# Patient Record
Sex: Female | Born: 1980 | Race: White | Hispanic: No | Marital: Single | Smoking: Former smoker
Health system: Southern US, Community
[De-identification: ages and names within clinical notes are randomized; demographics above are authoritative.]

## PROBLEM LIST (undated history)

## (undated) DIAGNOSIS — E282 Polycystic ovarian syndrome: Secondary | ICD-10-CM

## (undated) DIAGNOSIS — F4310 Post-traumatic stress disorder, unspecified: Secondary | ICD-10-CM

## (undated) HISTORY — DX: Post-traumatic stress disorder, unspecified: F43.10

## (undated) HISTORY — DX: Polycystic ovarian syndrome: E28.2

---

## 2008-12-05 HISTORY — PX: KNEE ARTHROSCOPY: SUR90

## 2016-02-05 ENCOUNTER — Other Ambulatory Visit: Payer: Self-pay | Admitting: Anesthesiology

## 2016-02-16 ENCOUNTER — Encounter (HOSPITAL_COMMUNITY): Payer: Self-pay

## 2016-02-16 ENCOUNTER — Encounter (HOSPITAL_COMMUNITY)
Admission: RE | Admit: 2016-02-16 | Discharge: 2016-02-16 | Disposition: A | Discharge status: Home / Self Care | Payer: Self-pay | Source: Ambulatory Visit | Attending: Anesthesiology | Admitting: Anesthesiology

## 2016-02-16 DIAGNOSIS — Z01812 Encounter for preprocedural laboratory examination: Secondary | ICD-10-CM | POA: Insufficient documentation

## 2016-02-16 DIAGNOSIS — G90512 Complex regional pain syndrome I of left upper limb: Secondary | ICD-10-CM | POA: Insufficient documentation

## 2016-02-16 LAB — BASIC METABOLIC PANEL
ANION GAP: 9 (ref 5–15)
BUN: 9 mg/dL (ref 6–20)
CHLORIDE: 110 mmol/L (ref 101–111)
CO2: 22 mmol/L (ref 22–32)
Calcium: 9.5 mg/dL (ref 8.9–10.3)
Creatinine, Ser: 0.75 mg/dL (ref 0.44–1.00)
GFR calc non Af Amer: >60 mL/min (ref >60)
Glucose, Bld: 93 mg/dL (ref 65–99)
POTASSIUM: 4.1 mmol/L (ref 3.5–5.1)
SODIUM: 141 mmol/L (ref 135–145)

## 2016-02-16 LAB — CBC
HEMATOCRIT: 45.9 % (ref 36.0–46.0)
HEMOGLOBIN: 14.7 g/dL (ref 12.0–15.0)
MCH: 30.1 pg (ref 26.0–34.0)
MCHC: 32 g/dL (ref 30.0–36.0)
MCV: 94.1 fL (ref 78.0–100.0)
Platelets: 324 10*3/uL (ref 150–400)
RBC: 4.88 MIL/uL (ref 3.87–5.11)
RDW: 12.6 % (ref 11.5–15.5)
WBC: 6.5 10*3/uL (ref 4.0–10.5)

## 2016-02-16 LAB — SURGICAL PCR SCREEN
MRSA, PCR: NEGATIVE
STAPHYLOCOCCUS AUREUS: NEGATIVE

## 2016-02-16 LAB — PROTIME-INR
INR: 1.03 (ref 0.00–1.49)
Prothrombin Time: 13.7 seconds (ref 11.6–15.2)

## 2016-02-16 LAB — HCG, SERUM, QUALITATIVE: PREG SERUM: NEGATIVE

## 2016-02-16 LAB — APTT: aPTT: 35 seconds (ref 24–37)

## 2016-02-16 NOTE — Pre-Procedure Instructions (Addendum)
Christine Johnston  02/16/2016      [facility] [id number] - [place], [place] - [address] AT [facility] [address] [place] [id number] Phone: [phone] Fax: [phone]    Your procedure is scheduled on 02/19/16  [facility] at 745 A.M.  Call this number if you have problems the morning of surgery:  [phone]   Remember:  Do not eat food or drink liquids after midnight.  Take these medicines the morning of surgery with A SIP OF WATER none   STOP all herbel meds, nsaids (aleve,naproxen,advil,ibuprofen)Today including vitamins ,aspirin   Do not wear jewelry, make-up or nail polish.  Do not wear lotions, powders, or perfumes.  You may wear deodorant.  Do not shave 48 hours prior to surgery.  Men may shave face and neck.  Do not bring valuables to the hospital.  [facility] is not responsible for any belongings or valuables.  Contacts, dentures or bridgework may not be worn into surgery.  Leave your suitcase in the car.  After surgery it may be brought to your room.  For patients admitted to the hospital, discharge time will be determined by your treatment team.  Patients discharged the day of surgery will not be allowed to drive home.   Name and phone number of your driver:    Special instructions:   Special Instructions: [place] - Preparing for Surgery  Before surgery, you can play an important role.  Because skin is not sterile, your skin needs to be as free of germs as possible.  You can reduce the number of germs on you skin by washing with CHG (chlorahexidine gluconate) soap before surgery.  CHG is an antiseptic cleaner which kills germs and bonds with the skin to continue killing germs even after washing.  Please DO NOT use if you have an allergy to CHG or antibacterial soaps.  If your skin becomes reddened/irritated stop using the CHG and inform your nurse when you arrive at Short Stay.  Do not  shave (including legs and underarms) for at least 48 hours prior to the first CHG shower.  You may shave your face.  Please follow these instructions carefully:   1.  Shower with CHG Soap the night before surgery and the morning of Surgery.  2.  If you choose to wash your hair, wash your hair first as usual with your normal shampoo.  3.  After you shampoo, rinse your hair and body thoroughly to remove the Shampoo.  4.  Use CHG as you would any other liquid soap.  You can apply chg directly  to the skin and wash gently with scrungie or a clean washcloth.  5.  Apply the CHG Soap to your body ONLY FROM THE NECK DOWN.  Do not use on open wounds or open sores.  Avoid contact with your eyes ears, mouth and genitals (private parts).  Wash genitals (private parts)       with your normal soap.  6.  Wash thoroughly, paying special attention to the area where your surgery will be performed.  7.  Thoroughly rinse your body with warm water from the neck down.  8.  DO NOT shower/wash with your normal soap after using and rinsing off the CHG Soap.  9.  Pat yourself dry with a clean towel.            10.  Wear clean pajamas.  11.  Place clean sheets on your bed the night of your first shower and do not sleep with pets.  Day of Surgery  Do not apply any lotions/deodorants the morning of surgery.  Please wear clean clothes to the hospital/surgery center.  Please read over the following fact sheets that you were given. Pain Booklet, Coughing and Deep Breathing, MRSA Information and Surgical Site Infection Prevention

## 2016-02-18 MED ORDER — CEFAZOLIN SODIUM-DEXTROSE 2-3 GM-% IV SOLR
2.0000 g | INTRAVENOUS | Status: AC
  Administered 2016-02-19: 2 g via INTRAVENOUS
  Filled 2016-02-18: qty 50

## 2016-02-18 NOTE — H&P (Signed)
Christine Johnston is an 35 y.o. female.   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  She was initially seen at [facility] and x-rays were performed that were normal.  She then underwent a series of conservative treatments.  Symptoms seemed to worsen, and Dr. Fredna Johnston, who she was seeing at the time, was concerned for CRPS.  She was referred to our practice  in December 2015 for further evaluation.  She indeed made [place] criteria for diagnosis of CRPS. She underwent a series of 6 stellate ganglion blocks with no sustainable relief.  She has trialed multiple medications to include amitriptyline, nortriptyline, Cymbalta, Topamax, clonidine, as well as compound creams with no benefit.  She continues to utilize Nucynta and Lyrica, with nominal to modest improvement in her pain symptoms.   She has also been through extensive physical therapy for desensitization.  An H wave unit was trialed while she was in physical therapy, and found to be at least modestly beneficial while it was on.  It does seem to serve to reduce some of her swelling/edema symptoms.  Given the limited improvement in her symptoms and signs with all of these various approaches, in May a trial of SCS was proposed.   There are substantive data for the utility of spinal cord stimulation in the treatment of this disease.  Christine Johnston has trialed intervention, and most of the drug classes for which there are data in the treatment of CRPS. Nucynta and Lyrica have provided modest benefit, but she only takes the Nucynta once a day because it makes her too drowsy, and cannont increase the Lyrica dose for the same reason.   The patient is a full-time student--clearly making an effort to retrain and be able to engage in the work force --and cannot take the medication as  prescribed due to making her too drowsy.   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   She noticed a 50% improvement in her pain symptoms with her SCS trial.  She also states that the swelling in her hand has improved significantly.  She is now able to see the knuckles on her left hand.  She also states that she has been utilizing less medication while utilizing the stimulator.    No past medical history on file.  Past Surgical History  Procedure Laterality Date  . Knee arthroscopy Left 2010    No family history on file. Social History:  reports that she has been smoking Cigarettes.  She has a 5 pack-year smoking history. She does not have any smokeless tobacco history on file. She reports that she does not drink alcohol or use illicit drugs.  Allergies:  Allergies  Allergen Reactions  . Tramadol Swelling    Facial swelling    Medications Prior to Admission  Medication Sig Dispense Refill  . pregabalin (LYRICA) 50 MG capsule Take 50 mg by mouth at bedtime.    . tapentadol (NUCYNTA) 50 MG TABS tablet Take 50 mg by mouth at bedtime.      Results for orders placed or performed during the hospital encounter of 02/16/16 (from the past 48 hour(s))  APTT  Status: None   Collection Time: 02/16/16  9:53 AM  Result Value Ref Range   aPTT 35 24 - 37 seconds  Protime-INR     Status: None   Collection Time: 02/16/16  9:53 AM  Result Value Ref Range   Prothrombin Time 13.7 11.6 - 15.2 seconds   INR 1.03 0.00 - 1.49  CBC     Status: None   Collection Time: 02/16/16  9:53 AM  Result Value Ref Range   WBC 6.5 4.0 - 10.5 K/uL   RBC 4.88 3.87 - 5.11 MIL/uL   Hemoglobin 14.7 12.0 - 15.0 g/dL   HCT 45.9 36.0 - 46.0 %   MCV 94.1 78.0 - 100.0 fL   MCH 30.1 26.0 - 34.0 pg   MCHC 32.0  30.0 - 36.0 g/dL   RDW 12.6 11.5 - 15.5 %   Platelets 324 150 - 400 K/uL  Basic metabolic panel     Status: None   Collection Time: 02/16/16  9:53 AM  Result Value Ref Range   Sodium 141 135 - 145 mmol/L   Potassium 4.1 3.5 - 5.1 mmol/L   Chloride 110 101 - 111 mmol/L   CO2 22 22 - 32 mmol/L   Glucose, Bld 93 65 - 99 mg/dL   BUN 9 6 - 20 mg/dL   Creatinine, Ser 0.75 0.44 - 1.00 mg/dL   Calcium 9.5 8.9 - 10.3 mg/dL   GFR calc non Af Amer >60 >60 mL/min   GFR calc Af Amer >60 >60 mL/min    Comment: (NOTE) The eGFR has been calculated using the CKD EPI equation. This calculation has not been validated in all clinical situations. eGFR's persistently <60 mL/min signify possible Chronic Kidney Disease.    Anion gap 9 5 - 15  Surgical pcr screen     Status: None   Collection Time: 02/16/16  9:53 AM  Result Value Ref Range   MRSA, PCR NEGATIVE NEGATIVE   Staphylococcus aureus NEGATIVE NEGATIVE    Comment:        The Xpert SA Assay (FDA approved for NASAL specimens in patients over 67 years of age), is one component of a comprehensive surveillance program.  Test performance has been validated by [facility] for patients greater than or equal to 65 year old. It is not intended to diagnose infection nor to guide or monitor treatment.   hCG, serum, qualitative     Status: None   Collection Time: 02/16/16  9:53 AM  Result Value Ref Range   Preg, Serum NEGATIVE NEGATIVE    Comment:        THE SENSITIVITY OF THIS METHODOLOGY IS >10 mIU/mL.    No results found.  Review of Systems  Constitutional: Negative.   HENT: Negative.   Eyes: Negative.   Respiratory: Negative.   Cardiovascular: Negative.   Gastrointestinal: Negative.   Genitourinary: Negative.   Musculoskeletal: Negative.   Skin: Negative.   Neurological: Negative.   Endo/Heme/Allergies: Negative.   Psychiatric/Behavioral: Negative.     Blood pressure 115/68, pulse 66, temperature 98.1 F (36.7 C),  temperature source Oral, resp. rate 18, weight 106.601 kg (235 lb 0.2 oz), last menstrual period 12/22/2015, SpO2 97 %. Physical Exam  Constitutional: She is oriented to person, place, and time. She appears well-developed and well-nourished.  HENT:  Head: Normocephalic and atraumatic.  Eyes: EOM are normal. Pupils are equal, round, and reactive to light.  Neck: Normal range of motion.  Cardiovascular: Normal rate and regular rhythm.  Musculoskeletal: Normal range of motion.  Neurological: She is alert and oriented to person, place, and time.  Skin: Skin is warm and dry.  Psychiatric: She has a normal mood and affect. Her behavior is normal. Judgment and thought content normal.     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

## 2016-02-19 ENCOUNTER — Encounter (HOSPITAL_COMMUNITY): Payer: Self-pay | Admitting: Anesthesiology

## 2016-02-19 ENCOUNTER — Ambulatory Visit (HOSPITAL_COMMUNITY): Payer: Worker's Compensation | Admitting: Anesthesiology

## 2016-02-19 ENCOUNTER — Ambulatory Visit (HOSPITAL_COMMUNITY): Payer: Worker's Compensation

## 2016-02-19 ENCOUNTER — Encounter (HOSPITAL_COMMUNITY)
Admission: RE | Disposition: A | Discharge status: Home / Self Care | Payer: Self-pay | Source: Ambulatory Visit | Attending: Anesthesiology

## 2016-02-19 ENCOUNTER — Ambulatory Visit (HOSPITAL_COMMUNITY)
Admission: RE | Admit: 2016-02-19 | Discharge: 2016-02-19 | Disposition: A | Discharge status: Home / Self Care | Payer: Worker's Compensation | Source: Ambulatory Visit | Attending: Anesthesiology | Admitting: Anesthesiology

## 2016-02-19 DIAGNOSIS — G90512 Complex regional pain syndrome I of left upper limb: Secondary | ICD-10-CM | POA: Diagnosis present

## 2016-02-19 DIAGNOSIS — F17210 Nicotine dependence, cigarettes, uncomplicated: Secondary | ICD-10-CM | POA: Insufficient documentation

## 2016-02-19 DIAGNOSIS — Z419 Encounter for procedure for purposes other than remedying health state, unspecified: Secondary | ICD-10-CM

## 2016-02-19 HISTORY — PX: SPINAL CORD STIMULATOR INSERTION: SHX5378

## 2016-02-19 SURGERY — CERVICAL SPINAL CORD STIMULATOR INSERTION
Anesthesia: Monitor Anesthesia Care | Site: Spine Cervical

## 2016-02-19 MED ORDER — MIDAZOLAM HCL 2 MG/2ML IJ SOLN
INTRAMUSCULAR | Status: AC
  Filled 2016-02-19: qty 2

## 2016-02-19 MED ORDER — STERILE WATER FOR INJECTION IJ SOLN
INTRAMUSCULAR | Status: AC
  Filled 2016-02-19: qty 10

## 2016-02-19 MED ORDER — 0.9 % SODIUM CHLORIDE (POUR BTL) OPTIME
TOPICAL | Status: DC | PRN
  Administered 2016-02-19: 1000 mL

## 2016-02-19 MED ORDER — FENTANYL CITRATE (PF) 250 MCG/5ML IJ SOLN
INTRAMUSCULAR | Status: AC
  Filled 2016-02-19: qty 5

## 2016-02-19 MED ORDER — ONDANSETRON HCL 4 MG/2ML IJ SOLN
INTRAMUSCULAR | Status: AC
  Filled 2016-02-19: qty 2

## 2016-02-19 MED ORDER — SODIUM CHLORIDE 0.9 % IJ SOLN
INTRAMUSCULAR | Status: AC
  Filled 2016-02-19: qty 10

## 2016-02-19 MED ORDER — PROMETHAZINE HCL 25 MG/ML IJ SOLN
6.2500 mg | INTRAMUSCULAR | Status: DC | PRN
  Administered 2016-02-19: 6.25 mg via INTRAVENOUS

## 2016-02-19 MED ORDER — LACTATED RINGERS IV SOLN
INTRAVENOUS | Status: DC
  Administered 2016-02-19: 09:00:00 via INTRAVENOUS

## 2016-02-19 MED ORDER — MIDAZOLAM HCL 5 MG/5ML IJ SOLN
INTRAMUSCULAR | Status: DC | PRN
  Administered 2016-02-19 (×2): 1 mg via INTRAVENOUS

## 2016-02-19 MED ORDER — DEXMEDETOMIDINE HCL IN NACL 200 MCG/50ML IV SOLN
INTRAVENOUS | Status: AC
  Filled 2016-02-19: qty 100

## 2016-02-19 MED ORDER — PROMETHAZINE HCL 25 MG/ML IJ SOLN
INTRAMUSCULAR | Status: AC
  Filled 2016-02-19: qty 1

## 2016-02-19 MED ORDER — BUPIVACAINE-EPINEPHRINE (PF) 0.5% -1:200000 IJ SOLN
INTRAMUSCULAR | Status: DC | PRN
  Administered 2016-02-19: 54 mL via PERINEURAL

## 2016-02-19 MED ORDER — HYDROMORPHONE HCL 1 MG/ML IJ SOLN: 0.2500 mg | INTRAMUSCULAR | Status: DC | PRN

## 2016-02-19 MED ORDER — LACTATED RINGERS IV SOLN
INTRAVENOUS | Status: DC | PRN
  Administered 2016-02-19: 09:00:00 via INTRAVENOUS

## 2016-02-19 MED ORDER — SUCCINYLCHOLINE CHLORIDE 20 MG/ML IJ SOLN
INTRAMUSCULAR | Status: AC
  Filled 2016-02-19: qty 1

## 2016-02-19 MED ORDER — HYDROMORPHONE HCL 4 MG PO TABS: 4.0000 mg | ORAL_TABLET | ORAL | Status: DC | PRN

## 2016-02-19 MED ORDER — PROPOFOL 10 MG/ML IV BOLUS
INTRAVENOUS | Status: AC
  Filled 2016-02-19: qty 20

## 2016-02-19 MED ORDER — PHENYLEPHRINE 40 MCG/ML (10ML) SYRINGE FOR IV PUSH (FOR BLOOD PRESSURE SUPPORT)
PREFILLED_SYRINGE | INTRAVENOUS | Status: AC
  Filled 2016-02-19: qty 10

## 2016-02-19 MED ORDER — CEPHALEXIN 500 MG PO CAPS: 500.0000 mg | ORAL_CAPSULE | Freq: Three times a day (TID) | ORAL | Status: DC

## 2016-02-19 MED ORDER — ARTIFICIAL TEARS OP OINT
TOPICAL_OINTMENT | OPHTHALMIC | Status: AC
  Filled 2016-02-19: qty 3.5

## 2016-02-19 MED ORDER — EPHEDRINE SULFATE 50 MG/ML IJ SOLN
INTRAMUSCULAR | Status: AC
  Filled 2016-02-19: qty 1

## 2016-02-19 MED ORDER — SODIUM CHLORIDE 0.9 % IR SOLN
Status: DC | PRN
  Administered 2016-02-19: 11:00:00

## 2016-02-19 MED ORDER — LIDOCAINE HCL (CARDIAC) 20 MG/ML IV SOLN
INTRAVENOUS | Status: AC
  Filled 2016-02-19: qty 10

## 2016-02-19 MED ORDER — FENTANYL CITRATE (PF) 250 MCG/5ML IJ SOLN
INTRAMUSCULAR | Status: DC | PRN
  Administered 2016-02-19 (×10): 50 ug via INTRAVENOUS

## 2016-02-19 MED ORDER — MEPERIDINE HCL 25 MG/ML IJ SOLN: 6.2500 mg | INTRAMUSCULAR | Status: DC | PRN

## 2016-02-19 MED ORDER — MIDAZOLAM HCL 2 MG/2ML IJ SOLN: 0.5000 mg | Freq: Once | INTRAMUSCULAR | Status: DC | PRN

## 2016-02-19 MED ORDER — DEXMEDETOMIDINE HCL IN NACL 200 MCG/50ML IV SOLN
INTRAVENOUS | Status: DC | PRN
  Administered 2016-02-19: 0.7 ug/kg/h via INTRAVENOUS

## 2016-02-19 MED ORDER — BACITRACIN ZINC 500 UNIT/GM EX OINT
TOPICAL_OINTMENT | CUTANEOUS | Status: DC | PRN
  Administered 2016-02-19: 1 via TOPICAL

## 2016-02-19 MED ORDER — PROPOFOL 10 MG/ML IV BOLUS
INTRAVENOUS | Status: DC | PRN
  Administered 2016-02-19 (×2): 10 mg via INTRAVENOUS
  Administered 2016-02-19 (×2): 20 mg via INTRAVENOUS
  Administered 2016-02-19 (×2): 10 mg via INTRAVENOUS

## 2016-02-19 SURGICAL SUPPLY — 58 items
ANCHOR CLIK X NEURO (Stimulator) ×2 IMPLANT
BAG DECANTER FOR FLEXI CONT (MISCELLANEOUS) ×2 IMPLANT
BINDER ABDOMINAL 12 ML 46-62 (SOFTGOODS) ×2 IMPLANT
CABLE OR STIMULATOR 2X8 61 (WIRE) ×2 IMPLANT
CABLE/EXTENSION OR 1X16 61 (CABLE) ×2 IMPLANT
CHLORAPREP W/TINT 26ML (MISCELLANEOUS) ×2 IMPLANT
DRAPE C-ARM 42X72 X-RAY (DRAPES) ×2 IMPLANT
DRAPE C-ARMOR (DRAPES) ×2 IMPLANT
DRAPE LAPAROTOMY 100X72X124 (DRAPES) ×2 IMPLANT
DRAPE POUCH INSTRU U-SHP 10X18 (DRAPES) ×2 IMPLANT
DRAPE SURG 17X23 STRL (DRAPES) ×2 IMPLANT
DRSG OPSITE POSTOP 3X4 (GAUZE/BANDAGES/DRESSINGS) ×4 IMPLANT
DRSG OPSITE POSTOP 4X6 (GAUZE/BANDAGES/DRESSINGS) IMPLANT
ELECT REM PT RETURN 9FT ADLT (ELECTROSURGICAL) ×2
ELECTRODE REM PT RTRN 9FT ADLT (ELECTROSURGICAL) ×1 IMPLANT
GAUZE SPONGE 4X4 16PLY XRAY LF (GAUZE/BANDAGES/DRESSINGS) ×2 IMPLANT
GLOVE BIOGEL PI IND STRL 7.5 (GLOVE) ×1 IMPLANT
GLOVE BIOGEL PI INDICATOR 7.5 (GLOVE) ×1
GLOVE ECLIPSE 7.5 STRL STRAW (GLOVE) ×2 IMPLANT
GLOVE EXAM NITRILE LRG STRL (GLOVE) IMPLANT
GLOVE EXAM NITRILE MD LF STRL (GLOVE) IMPLANT
GLOVE EXAM NITRILE XL STR (GLOVE) IMPLANT
GLOVE EXAM NITRILE XS STR PU (GLOVE) IMPLANT
GOWN STRL REUS W/ TWL LRG LVL3 (GOWN DISPOSABLE) IMPLANT
GOWN STRL REUS W/ TWL XL LVL3 (GOWN DISPOSABLE) IMPLANT
GOWN STRL REUS W/TWL 2XL LVL3 (GOWN DISPOSABLE) IMPLANT
GOWN STRL REUS W/TWL LRG LVL3 (GOWN DISPOSABLE)
GOWN STRL REUS W/TWL XL LVL3 (GOWN DISPOSABLE)
IPG PRECISION SPECTRA (Stimulator) ×2 IMPLANT
KIT BASIN OR (CUSTOM PROCEDURE TRAY) ×2 IMPLANT
KIT CHARGING (KITS) ×1
KIT CHARGING PRECISION NEURO (KITS) ×1 IMPLANT
KIT LEAD INFINION (Lead) ×2 IMPLANT
KIT PAT PROGRAM FREELINK (KITS) ×1 IMPLANT
KIT ROOM TURNOVER OR (KITS) ×2 IMPLANT
KIT SPLITTER 30CM 2X8 (Stimulator) ×4 IMPLANT
LEAD PERCUTANEOUS (Neuro Prosthesis/Implant) ×2 IMPLANT
LIQUID BAND (GAUZE/BANDAGES/DRESSINGS) IMPLANT
NEEDLE 18GX1X1/2 (RX/OR ONLY) (NEEDLE) IMPLANT
NEEDLE ENTRADA 5IN (NEEDLE) ×2 IMPLANT
NEEDLE HYPO 25X1 1.5 SAFETY (NEEDLE) ×2 IMPLANT
NS IRRIG 1000ML POUR BTL (IV SOLUTION) ×2 IMPLANT
PACK LAMINECTOMY NEURO (CUSTOM PROCEDURE TRAY) ×2 IMPLANT
PAD ARMBOARD 7.5X6 YLW CONV (MISCELLANEOUS) ×2 IMPLANT
REMOTE CONTROL KIT (KITS) ×2
SPONGE LAP 4X18 X RAY DECT (DISPOSABLE) IMPLANT
STAPLER SKIN PROX WIDE 3.9 (STAPLE) ×2 IMPLANT
SUT MNCRL AB 3-0 PS2 18 (SUTURE) IMPLANT
SUT MNCRL AB 4-0 PS2 18 (SUTURE) IMPLANT
SUT SILK 0 (SUTURE) ×1
SUT SILK 0 MO-6 18XCR BRD 8 (SUTURE) ×1 IMPLANT
SUT SILK 2 0 TIES 10X30 (SUTURE) IMPLANT
SUT VIC AB 2-0 CP2 18 (SUTURE) ×8 IMPLANT
SYRINGE 10CC LL (SYRINGE) IMPLANT
TOWEL OR 17X24 6PK STRL BLUE (TOWEL DISPOSABLE) IMPLANT
TOWEL OR 17X26 10 PK STRL BLUE (TOWEL DISPOSABLE) ×2 IMPLANT
WATER STERILE IRR 1000ML POUR (IV SOLUTION) ×2 IMPLANT
YANKAUER SUCT BULB TIP NO VENT (SUCTIONS) ×2 IMPLANT

## 2016-02-19 NOTE — Anesthesia Preprocedure Evaluation (Addendum)
Anesthesia Evaluation  Patient identified by MRN, date of birth, ID band Patient awake    Reviewed: Allergy & Precautions, NPO status , Patient's Chart, lab work & pertinent test results  History of Anesthesia Complications Negative for: history of anesthetic complications  Airway Mallampati: II  TM Distance: >3 FB Neck ROM: Full    Dental  (+) Poor Dentition, Dental Advisory Given   Pulmonary neg pulmonary ROS, Current Smoker,    breath sounds clear to auscultation       Cardiovascular negative cardio ROS   Rhythm:Regular Rate:Normal     Neuro/Psych Chronic pain: narcotics    GI/Hepatic negative GI ROS, Neg liver ROS,   Endo/Other  Morbid obesity  Renal/GU negative Renal ROS     Musculoskeletal   Abdominal (+) + obese,   Peds  Hematology negative hematology ROS (+)   Anesthesia Other Findings   Reproductive/Obstetrics 02/16/16 preg test NEG                           Anesthesia Physical Anesthesia Plan  ASA: II  Anesthesia Plan: MAC   Post-op Pain Management:    Induction:   Airway Management Planned: Nasal Cannula  Additional Equipment:   Intra-op Plan:   Post-operative Plan:   Informed Consent: I have reviewed the patients History and Physical, chart, labs and discussed the procedure including the risks, benefits and alternatives for the proposed anesthesia with the patient or authorized representative who has indicated his/her understanding and acceptance.   Dental advisory given  Plan Discussed with: Anesthesiologist, Surgeon and CRNA  Anesthesia Plan Comments: (Plan routine monitors, MAC)       Anesthesia Quick Evaluation

## 2016-02-19 NOTE — Discharge Instructions (Signed)
Dr. Ollen BowlHarkins Post-Op Orders   Ice Pack - 20 minutes on (in a pillow case), and 20 minutes off. Wear the ice pack UNDER the binder.  Follow up in office, they will call you for an appointment in 10 days to 2 weeks.  Increase activity gradually.    No lifting anything heavier than a gallon of milk (10 pounds) until seen in the office.  Advance diet slowly as tolerated.  Dressing care:  Keep dressing dry for 3 days, and on Post-op day 4, may shower.  Call for fever, drainage, and redness.  No swimming or bathing in a bathtub (do not get into standing water).  Lie flat as much as possible for the next 2 days. Drink plenty of fluids, including caffeinated.

## 2016-02-19 NOTE — Op Note (Signed)
PREOP DX: 1) CRPS left upper extremity  POSTOP DX: same as preop  PROCEDURES PERFORMED: 1) implantation of 1 70 cm 16 contact [employer] SCS lead, 1 70 cm 8 contact [employer] SCS lead 2) implantation of [employer] Spectra IPG 3) I/O fluoro with interpretation 4) postoperative complex SCS programming  SURGEON: Rilla Buckman  ASSISTANT: none  ANESTHESIA: MAC/TIVA  EBL:<45mL  DESCRIPTION OF PROCEDURE: After a discussion of risks, benefits and alternatives, written informed consent was obtained. The patient was taken to the operative suite where she was placed on the table in the prone position with chest rolls and a head/face cushion. A timeout was taken, identifying the patient, procedure, personnel, equipment, antibiotic administration and staff concerns (of which there were none).   The cervical, thoracic and lumbar spine were widely prepped with duraprep, and draped into a sterile field. An adequate plane of anesthesia was established. Fluoroscopy was utilized to plan a left paramedian placement of leads into the T11-12 space and then after anesthetizing the skin and subcutaneous tissue with 0.25% bupivicaine 1:200K epinephrine, an incision was made and carried down to the dorsal lumbar  fascia. 14g Touhy needles were used to access the intended space using biplanar fluoroscopy and loss-of-resistance technique, and two 70 cm leads were manipulated into the cervical epidural space under live fluoroscopy so that the distal tip of the 16 contact lead  Was at the superior aspect of the C5 vertebral body shadow, and the octrode positioned across C6;  the 16 contact lead was positioned immediately left paramedian, and the octrode immediately left of the other lead. The patient was aroused, and the leads tested to verify good coverage. The patient reported good coverage into the left upper extremities and particularly the hand.   With good coverage established, the needles and stylets  were backed off the leads, with fluoro verifying no migration of the leads from their tested positions. 2-0 silk sutures were placed into the fascia, tied, and then used to fix the leads to the fascia using [employer] anchors.   Attention was turned to creation of a subcutaneous pocket in the patients right flank. An incision was planned, the skin and subcutaneous tissues anesthetized with 0.25% bupivicaine 1:200K epinephrine, and then an incision made and a pocket developed using blunt dissection and the Bovie electrocautery. Hemostasis was assured in the pocket, and then the leads passed into the pocket using a reverse Seldinger technique. The leads were meticulously cleaned, and inserted into the generator. Impedances were checked and found to be good in each contact; the leads were then fixed into the generator using a self torquing wrench. Impedances were rechecked and found to be good in each contact.   Each incision was copiously irrigated with over 800 mL of bacitracin-containing irrigant. The thoracolumbar incision was closed with 2 layers of interrupted 2-0 vicryl sutures and the skin closed with a staples. The pocket incision was closed with a deep layer of 2-0 interrupted vicryl sutures, and the skin closed with staples. Bacitracin ointment and sterile dressings were applied and the patient taken to the PACU. Needle, instrument and sponge counts were correct x2 at the end of the case.  COMPLICATIONS: lumbar puncture with attempts to place the 2nd lead into the T12-L1 interspace. 2nd lead was placed in the T11-12 interspace and no other CSF was observed throughout the rest of the case.   CONDITION: stable throughout the course of the procedure and immediately afterward to PACU  DISPOSITION: Discharge home. Pain  medicine and prophylactic antibiotics prescribed.  May remove dressings and shower on post-op day #4. No submerging. Case and care discussed with patient and fiance.

## 2016-02-19 NOTE — Anesthesia Postprocedure Evaluation (Signed)
Anesthesia Post Note  Patient: Christine Johnston  Procedure(s) Performed: Procedure(s) (LRB): CERVICAL SPINAL CORD STIMULATOR INSERTION (N/A)  Patient location during evaluation: PACU Anesthesia Type: General Level of consciousness: awake and alert, oriented and patient cooperative Pain management: pain level controlled Vital Signs Assessment: post-procedure vital signs reviewed and stable Respiratory status: spontaneous breathing, nonlabored ventilation and respiratory function stable Cardiovascular status: blood pressure returned to baseline and stable Postop Assessment: no signs of nausea or vomiting Anesthetic complications: no    Last Vitals:  Filed Vitals:   02/19/16 1337 02/19/16 1400  BP: 101/60   Pulse: 49 44  Temp:  36.1 C  Resp: 23 21    Last Pain:  Filed Vitals:   02/19/16 1442  PainSc: 6                  Jakyrie Totherow,E. Sharmeka Palmisano

## 2016-02-19 NOTE — Transfer of Care (Signed)
Immediate Anesthesia Transfer of Care Note  Patient: Christine Johnston  Procedure(s) Performed: Procedure(s): CERVICAL SPINAL CORD STIMULATOR INSERTION (N/A)  Patient Location: PACU  Anesthesia Type:MAC  Level of Consciousness: sedated  Airway & Oxygen Therapy: Patient Spontanous Breathing and Patient connected to nasal cannula oxygen  Post-op Assessment: Report given to RN and Post -op Vital signs reviewed and stable  Post vital signs: Reviewed and stable  Last Vitals:  Filed Vitals:   02/19/16 0814  BP: 115/68  Pulse: 66  Temp: 36.7 C  Resp: 18    Complications: No apparent anesthesia complications

## 2016-02-22 ENCOUNTER — Encounter (HOSPITAL_COMMUNITY): Payer: Self-pay | Admitting: Anesthesiology

## 2017-04-10 IMAGING — RF DG C-ARM GT 120 MIN
1 series · 1 of 1 positions shown · non-contrast
Comparison: None.

CLINICAL DATA: Cervical spinal stimulator

EXAM:
DG C-ARM GT 120 MIN; DG CERVICAL SPINE - 1 VIEW

[Series 1: run · 1 of 1 slices shown]
[im 1/1]
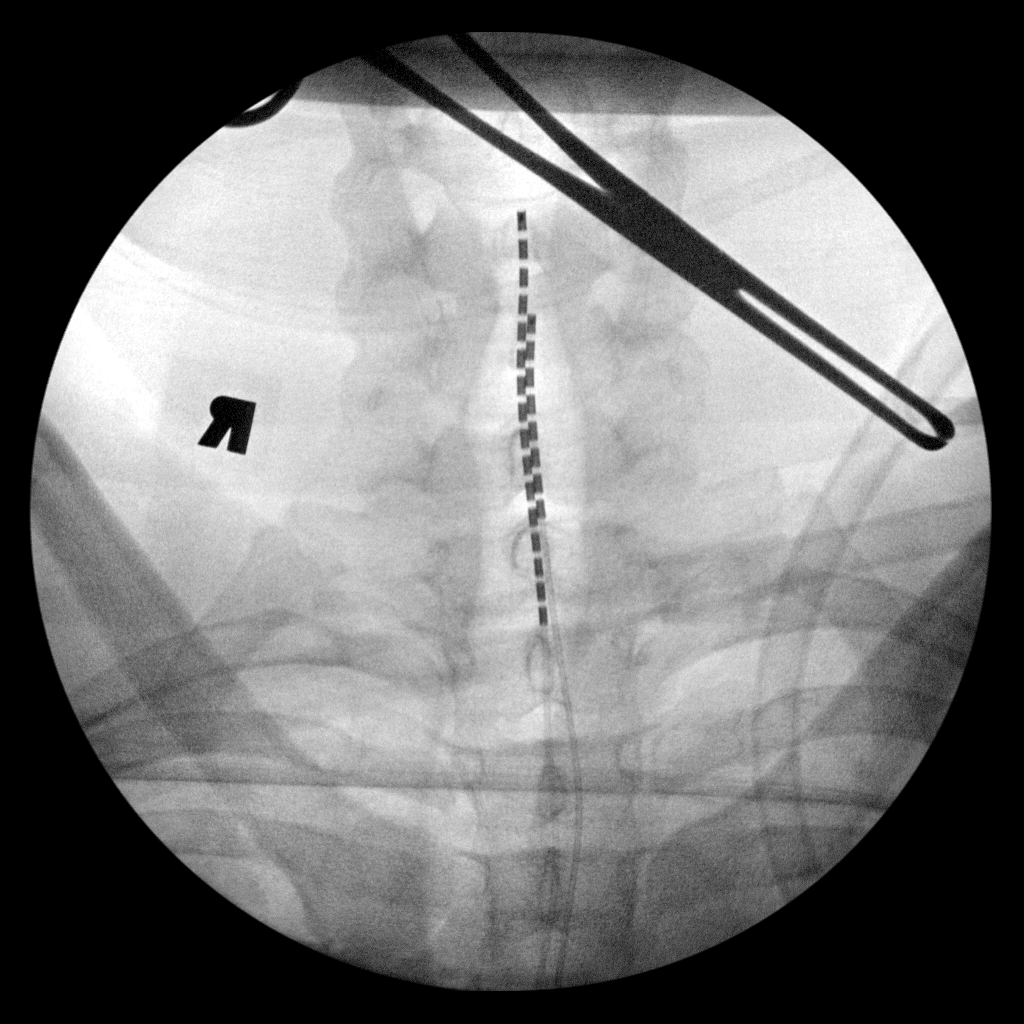

[1 of 1 positions shown; findings below may reference images not displayed]

FINDINGS: Single intraoperative image demonstrates spinal stimulator
projecting over the C5 level.
IMPRESSION: Spinal stimulator position as above.

## 2018-12-05 HISTORY — PX: OTHER SURGICAL HISTORY: SHX169

## 2022-09-01 ENCOUNTER — Telehealth: Payer: Self-pay

## 2022-09-01 NOTE — Telephone Encounter (Signed)
Summer called from [facility] office to update patients number.  Christine Johnston's new number is [phone]..   called and spoke with Hassan Rowan and setup new patient visit on 09/09/2022 at 9:00am with Dr Berline Lopes. Patient confirmed and understood.

## 2022-09-01 NOTE — Telephone Encounter (Signed)
Called referring doctor office at [phone] to see about getting another number for this patient.  Paperwork has [phone] and that's a wrong number.  Paperwork from doctor office has [phone] and it just rings and no answer and no way to leave voicemail.

## 2022-09-07 ENCOUNTER — Encounter: Payer: Self-pay | Admitting: Gynecologic Oncology

## 2022-09-08 ENCOUNTER — Encounter: Payer: Self-pay | Admitting: Gynecologic Oncology

## 2022-09-08 NOTE — Progress Notes (Signed)
GYNECOLOGIC ONCOLOGY NEW PATIENT CONSULTATION   Patient Name: Christine Johnston  Patient Age: 41 y.o. Date of Service: 09/08/22 Referring Provider: Fermin Schwab, MD [address] [place]. 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  We reviewed the diagnosis of EIN and the treatment options, including medical management (Mirena IUD or progesterone PO) or hysterectomy.    Given her desire to maintain fertility, she has elected medical management with the goal of disease clearance and prevention of invasive adenocarcinoma.    We reviewed the role of progesterone therapy and the effect on preneoplastic lesions, believed to include induction of apoptosis in addition to tissue sloughing during withdrawal bleeding.  Activation of the progesterone receptors is believed to lead stromal decidualization and thinning of the lining.  We reviewed the 3 most studied options, to include levonorgesterol IUD (74mcg/d), oral medorxyprogesterone acetate 10mg  daily or cyclically 12-14 days per month, or oral megesterol acetate 40-200mg  per day.    She understands that all options have few side effects, most common being infrequent edema, GI disturbances, and thromboembolic events), but that local progesterone through IUD may have a stronger effect on the endometrium with less systemic side effects.  Furthermore, studies demonstrate that 80-90% of women will have regression of their hyperplasia with progesterone use.    With IUD use, we anticipate that the average duration to regression is 4.5 months, with all cases anticipated to have regression by 9 months.     She has elected to proceed with Mirena IUD insertion.  She will return to my clinic in 2 weeks to have an IUD placed.  Since she had visualization of the endometrial cavity with more extensive sampling, I do not  think that D&C is necessary at the time of her IUD insertion.  For monitoring, she understands that there is no recommend standard of care.  We will base her surveillance on GOG 224 protocol.  This will include EMB at 3 months following initiation of treatment.  EMBs will be performed at 3 to 6 month intervals, until a minimum of 3 negative biopsy results are obtained, after which sampling frequeny may then be yearly or until new abnormal uterine bleeding develops.  If persistence of CAH is noted by 9 months of treatment, then we will discuss additional agents or possible readiness for definitive surgery.    We also reviewed the importance of weight loss in overall health as well as reduction in cancer risk.    Patient was previously started on metformin.  We discussed that there are some data to support the anti-cancer properties of metformin as well as its role in the treatment of metabolic syndrome. I encouraged the patient to continue on metformin.  Patient is due for Pap testing.  We will plan to do cotesting at her follow-up visit.  A copy of this note was sent to the patient's referring provider.   65 minutes of total time was spent for this patient encounter, including preparation, face-to-face counseling with the patient and coordination of care, and documentation of the encounter.   , MD  Division of Gynecologic Oncology  Department of Obstetrics and Gynecology  [facility]  ___________________________________________  Chief Complaint: Chief Complaint  Patient presents with   Complex atypical endometrial hyperplasia    History of Present Illness:  Christine Johnston is a 41 y.o. y.o. female who is seen  in consultation at the request of Governor Specking, MD for an evaluation of EIN.  Patient was initially seen in mid August for infertility in the setting of a history of polycystic ovarian syndrome.  She reports having 5 periods a year.   She had previously received infertility treatment and undergone IVF cycles.  Unfortunately, she had to embryo transfers that did not result in a pregnancy.  Pelvic ultrasound on 07/18/2022 showed a uterus measuring 6.6 x 3.6 x 4.9 cm.  Endometrium measured 7.3 mm and had a microcystic structure.  Bilateral ovaries normal in size with antral follicle counts of 50, consistent with a polycystic morphology.  08/16/2022: Seen for a saline sonohysterogram and trial transfer in preparation for an upcoming IVF cycle.  Findings from this procedure included a normal-appearing uterine cavity without evidence of uterine polyps, submucosal fibroids, or intrauterine adhesions.  Endometrial biopsy was taken and shows fragmented proliferative endometrium with atypia consistent with complex atypical hyperplasia.  Patient denies any bleeding after her biopsy.  She tells me that after menarche at age 41, she had regular monthly menses.  She entered the service at age 41 and was started on Depo-Provera, which she continued for 2 years.  When she came off of the Depo-Provera, she began having regular menses again until 2012.  Her menstrual cycles became sporadic afterwards.  She would have a couple of months of regular menses and then not have any bleeding for months.  She typically has 3-4 menses a year.  Her menses last 3 to 5 days with heavier bleeding on the first day or 2.  Patient denies any pelvic or abdominal pain.  She reports regular bowel and bladder function.  She denies any recent weight changes.  Patient had some fertility treatment in [place].  She reports having an endometrial biopsy with them in 2022 where polyps were found, no hyperplasia or malignancy per her report.  She underwent 2 IVF cycles with 1 frozen embryo transfer.  She thinks that she had hormone testing there that showed her testosterone was high.   Patient is a English as a second language teacher.  She lives with her husband.  PAST MEDICAL HISTORY:  Past Medical  History:  Diagnosis Date   PCOS (polycystic ovarian syndrome)    PTSD (post-traumatic stress disorder)      PAST SURGICAL HISTORY:  Past Surgical History:  Procedure Laterality Date   KNEE ARTHROSCOPY Left 12/05/2008   SPINAL CORD STIMULATOR INSERTION N/A 02/19/2016   Procedure: CERVICAL SPINAL CORD STIMULATOR INSERTION;  Surgeon: Clydell Hakim, MD;  Location: [place] [facility];  Service: Neurosurgery;  Laterality: N/A;   transvaginal oocyte retrieval  2020   2021    OB/GYN HISTORY:  OB History  Gravida Para Term Preterm AB Living  0 0 0 0 0 0  SAB IAB Ectopic Multiple Live Births  0 0 0 0 0    No LMP recorded.  Age at menarche: 10 Hx of STDs: Denies Last pap: 2018 History of abnormal pap smears: Denies  SCREENING STUDIES:  Last mammogram: Has never had  Last colonoscopy: Has never had  MEDICATIONS: Outpatient Encounter Medications as of 09/09/2022  Medication Sig   levonorgestrel (MIRENA) 20 MCG/DAY IUD Place one IUD intrauterine route once   metFORMIN (GLUCOPHAGE) 500 MG tablet Take by mouth.   [DISCONTINUED] cephALEXin (KEFLEX) 500 MG capsule Take 1 capsule (500 mg total) by mouth 3 (three) times daily.   [DISCONTINUED] HYDROmorphone (DILAUDID) 4 MG tablet Take 1 tablet (4 mg total) by mouth every 4 (four)  hours as needed for moderate pain or severe pain.   [DISCONTINUED] pregabalin (LYRICA) 50 MG capsule Take 50 mg by mouth at bedtime.   [DISCONTINUED] tapentadol (NUCYNTA) 50 MG TABS tablet Take 50 mg by mouth at bedtime.   No facility-administered encounter medications on file as of 09/09/2022.    ALLERGIES:  Allergies  Allergen Reactions   Tramadol Swelling    Facial swelling     FAMILY HISTORY:  Family History  Problem Relation Age of Onset   Cervical cancer Sister        age 25   Leukemia Maternal Grandmother    Breast cancer Paternal Grandmother    Cancer - Ovarian Paternal Great-grandmother    Endometrial cancer Neg Hx    Prostate cancer Neg Hx     Pancreatic cancer Neg Hx      SOCIAL HISTORY:  Social Connections: Not on file    REVIEW OF SYSTEMS:  Denies appetite changes, fevers, chills, fatigue, unexplained weight changes. Denies hearing loss, neck lumps or masses, mouth sores, ringing in ears or voice changes. Denies cough or wheezing.  Denies shortness of breath. Denies chest pain or palpitations. Denies leg swelling. Denies abdominal distention, pain, blood in stools, constipation, diarrhea, nausea, vomiting, or early satiety. Denies pain with intercourse, dysuria, frequency, hematuria or incontinence. Denies hot flashes, pelvic pain, vaginal bleeding or vaginal discharge.   Denies joint pain, back pain or muscle pain/cramps. Denies itching, rash, or wounds. Denies dizziness, headaches, numbness or seizures. Denies swollen lymph nodes or glands, denies easy bruising or bleeding. Denies anxiety, depression, confusion, or decreased concentration.  Physical Exam:  Vital Signs for this encounter:  Blood pressure (!) 142/75, pulse 69, temperature 98.4 F (36.9 C), temperature source Oral, resp. rate 18, height 5' 10.87" (1.8 m), weight 276 lb 11.2 oz (125.5 kg), SpO2 100 %. Body mass index is 38.74 kg/m. General: Alert, oriented, no acute distress.  HEENT: Normocephalic, atraumatic. Sclera anicteric.  Chest: Clear to auscultation bilaterally. No wheezes, rhonchi, or rales. Cardiovascular: Regular rate and rhythm, no murmurs, rubs, or gallops.  Abdomen: Obese. Normoactive bowel sounds. Soft, nondistended, nontender to palpation. No masses or hepatosplenomegaly appreciated. No palpable fluid wave.  Extremities: Grossly normal range of motion. Warm, well perfused. No edema bilaterally.  Skin: No rashes or lesions.  GU: Deferred.  LABORATORY AND RADIOLOGIC DATA:  Outside medical records were reviewed to synthesize the above history, along with the history and physical obtained during the visit.   Lab Results  Component Value  Date   WBC 6.5 02/16/2016   HGB 14.7 02/16/2016   HCT 45.9 02/16/2016   PLT 324 02/16/2016   GLUCOSE 93 02/16/2016   NA 141 02/16/2016   K 4.1 02/16/2016   CL 110 02/16/2016   CREATININE 0.75 02/16/2016   BUN 9 02/16/2016   CO2 22 02/16/2016   INR 1.03 02/16/2016

## 2022-09-09 ENCOUNTER — Encounter: Payer: Self-pay | Admitting: Gynecologic Oncology

## 2022-09-09 ENCOUNTER — Inpatient Hospital Stay: Payer: BLUE CROSS/BLUE SHIELD | Attending: Gynecologic Oncology | Admitting: Gynecologic Oncology

## 2022-09-09 ENCOUNTER — Other Ambulatory Visit (HOSPITAL_COMMUNITY): Payer: Self-pay

## 2022-09-09 ENCOUNTER — Other Ambulatory Visit: Payer: Self-pay

## 2022-09-09 VITALS — BP 142/75 | HR 69 | Temp 98.4°F | Resp 18 | Ht 70.87 in | Wt 276.7 lb

## 2022-09-09 DIAGNOSIS — Z7984 Long term (current) use of oral hypoglycemic drugs: Secondary | ICD-10-CM | POA: Diagnosis not present

## 2022-09-09 DIAGNOSIS — E282 Polycystic ovarian syndrome: Secondary | ICD-10-CM | POA: Insufficient documentation

## 2022-09-09 DIAGNOSIS — Z79890 Hormone replacement therapy (postmenopausal): Secondary | ICD-10-CM | POA: Diagnosis not present

## 2022-09-09 DIAGNOSIS — N970 Female infertility associated with anovulation: Secondary | ICD-10-CM

## 2022-09-09 DIAGNOSIS — Z6838 Body mass index (BMI) 38.0-38.9, adult: Secondary | ICD-10-CM | POA: Diagnosis not present

## 2022-09-09 DIAGNOSIS — N8502 Endometrial intraepithelial neoplasia [EIN]: Secondary | ICD-10-CM

## 2022-09-09 DIAGNOSIS — N979 Female infertility, unspecified: Secondary | ICD-10-CM | POA: Diagnosis not present

## 2022-09-09 DIAGNOSIS — E669 Obesity, unspecified: Secondary | ICD-10-CM

## 2022-09-09 DIAGNOSIS — F4310 Post-traumatic stress disorder, unspecified: Secondary | ICD-10-CM | POA: Insufficient documentation

## 2022-09-09 MED ORDER — LEVONORGESTREL 20 MCG/DAY IU IUD
1.0000 | INTRAUTERINE_SYSTEM | Freq: Once | INTRAUTERINE | 0 refills | Status: DC
  Filled 2022-09-09: qty 1, 1d supply, fill #0

## 2022-09-09 NOTE — Patient Instructions (Addendum)
It was very nice to meet you today.  I will see you back in 2 weeks for an exam.  We will put in a progesterone secreting IUD at that time as well as perform a Pap test.  Please see the attached information about the Mirena intrauterine device as well as your diagnosis of precancer of the lining of the uterus.  In terms of follow-up, we discussed today plan for follow-up every 3 months with a repeat biopsy.  Once we have gotten 3 negative biopsies, the IUD could be removed to allow you to attempt pregnancy.  We will get you scheduled for that follow-up at your next visit.  We will plan to sent the prescription for the Mirena IUD to our outpatient pharmacy and we will let you know if there is a copay needed. This can be taken care of over the phone if needed with the pharmacy. We will pick up the IUD and have it in the office.

## 2022-09-15 ENCOUNTER — Other Ambulatory Visit (HOSPITAL_COMMUNITY): Payer: Self-pay

## 2022-09-21 ENCOUNTER — Encounter: Payer: Self-pay | Admitting: Gynecologic Oncology

## 2022-09-23 ENCOUNTER — Inpatient Hospital Stay: Payer: BLUE CROSS/BLUE SHIELD

## 2022-09-23 ENCOUNTER — Inpatient Hospital Stay: Payer: BLUE CROSS/BLUE SHIELD | Admitting: Gynecologic Oncology

## 2022-09-23 DIAGNOSIS — N8502 Endometrial intraepithelial neoplasia [EIN]: Secondary | ICD-10-CM

## 2022-09-23 NOTE — Progress Notes (Unsigned)
Appt rescheduled

## 2022-09-26 ENCOUNTER — Encounter: Payer: Self-pay | Admitting: Gynecologic Oncology

## 2022-10-04 ENCOUNTER — Encounter: Payer: Self-pay | Admitting: Gynecologic Oncology

## 2022-10-04 ENCOUNTER — Inpatient Hospital Stay (HOSPITAL_BASED_OUTPATIENT_CLINIC_OR_DEPARTMENT_OTHER): Payer: BLUE CROSS/BLUE SHIELD | Admitting: Gynecologic Oncology

## 2022-10-04 ENCOUNTER — Inpatient Hospital Stay: Payer: BLUE CROSS/BLUE SHIELD

## 2022-10-04 ENCOUNTER — Other Ambulatory Visit: Payer: Self-pay

## 2022-10-04 VITALS — BP 152/90 | HR 88 | Temp 98.6°F | Resp 18 | Ht 71.06 in | Wt 280.5 lb

## 2022-10-04 DIAGNOSIS — N970 Female infertility associated with anovulation: Secondary | ICD-10-CM | POA: Diagnosis not present

## 2022-10-04 DIAGNOSIS — N8502 Endometrial intraepithelial neoplasia [EIN]: Secondary | ICD-10-CM | POA: Diagnosis not present

## 2022-10-04 DIAGNOSIS — E669 Obesity, unspecified: Secondary | ICD-10-CM

## 2022-10-04 LAB — PREGNANCY, URINE: Preg Test, Ur: NEGATIVE

## 2022-10-04 NOTE — Patient Instructions (Signed)
Some light cramping and spotting over the next couple of days would be normal.  If you have any other symptoms, please call and let me know.  I will see you back for follow-up in 3 months.  We will plan to do a Pap test as well as a repeat biopsy at that time.

## 2022-10-04 NOTE — Progress Notes (Signed)
Gynecologic Oncology Return Clinic Visit  10/04/22  Reason for Visit: Mirena IUD placement  Treatment History: Patient was initially seen in mid August for infertility in the setting of a history of polycystic ovarian syndrome.  She reports having 5 periods a year.  She had previously received infertility treatment and undergone IVF cycles.  Unfortunately, she had to embryo transfers that did not result in a pregnancy.   Pelvic ultrasound on 07/18/2022 showed a uterus measuring 6.6 x 3.6 x 4.9 cm.  Endometrium measured 7.3 mm and had a microcystic structure.  Bilateral ovaries normal in size with antral follicle counts of 50, consistent with a polycystic morphology.   08/16/2022: Seen for a saline sonohysterogram and trial transfer in preparation for an upcoming IVF cycle.  Findings from this procedure included a normal-appearing uterine cavity without evidence of uterine polyps, submucosal fibroids, or intrauterine adhesions.  Endometrial biopsy was taken and shows fragmented proliferative endometrium with atypia consistent with complex atypical hyperplasia.  Interval History: Doing well, no new symptoms or issues since her last visit with me.  Past Medical/Surgical History: Past Medical History:  Diagnosis Date   PCOS (polycystic ovarian syndrome)    PTSD (post-traumatic stress disorder)     Past Surgical History:  Procedure Laterality Date   KNEE ARTHROSCOPY Left 12/05/2008   SPINAL CORD STIMULATOR INSERTION N/A 02/19/2016   Procedure: CERVICAL SPINAL CORD STIMULATOR INSERTION;  Surgeon: Clydell Hakim, MD;  Location: [place] [facility];  Service: Neurosurgery;  Laterality: N/A;   transvaginal oocyte retrieval  2020   2021    Family History  Problem Relation Age of Onset   Cervical cancer Sister        age 56   Leukemia Maternal Grandmother    Breast cancer Paternal Grandmother    Cancer - Ovarian Paternal Great-grandmother    Endometrial cancer Neg Hx    Prostate cancer Neg Hx     Pancreatic cancer Neg Hx     Social History   Socioeconomic History   Marital status: Single    Spouse name: Not on file   Number of children: Not on file   Years of education: Not on file   Highest education level: Not on file  Occupational History   Not on file  Tobacco Use   Smoking status: Former    Packs/day: 0.50    Years: 10.00    Total pack years: 5.00    Types: Cigarettes   Smokeless tobacco: Never  Substance and Sexual Activity   Alcohol use: No   Drug use: No   Sexual activity: Yes  Other Topics Concern   Not on file  Social History Narrative   Not on file   Social Determinants of Health   Financial Resource Strain: Not on file  Food Insecurity: Not on file  Transportation Needs: Not on file  Physical Activity: Not on file  Stress: Not on file  Social Connections: Not on file    Current Medications:  Current Outpatient Medications:    metFORMIN (GLUCOPHAGE) 500 MG tablet, Take by mouth., Disp: , Rfl:   Review of Systems: Denies appetite changes, fevers, chills, fatigue, unexplained weight changes. Denies hearing loss, neck lumps or masses, mouth sores, ringing in ears or voice changes. Denies cough or wheezing.  Denies shortness of breath. Denies chest pain or palpitations. Denies leg swelling. Denies abdominal distention, pain, blood in stools, constipation, diarrhea, nausea, vomiting, or early satiety. Denies pain with intercourse, dysuria, frequency, hematuria or incontinence. Denies hot flashes, pelvic pain,  vaginal bleeding or vaginal discharge.   Denies joint pain, back pain or muscle pain/cramps. Denies itching, rash, or wounds. Denies dizziness, headaches, numbness or seizures. Denies swollen lymph nodes or glands, denies easy bruising or bleeding. Denies anxiety, depression, confusion, or decreased concentration.  Physical Exam: BP (!) 152/90 (BP Location: Right Arm, Patient Position: Sitting) Comment: no BP meds; informed Dr Pricilla Holm   Pulse 88   Temp 98.6 F (37 C) (Oral)   Resp 18   Ht 5' 11.06" (1.805 m)   Wt 280 lb 8 oz (127.2 kg)   SpO2 98%   BMI 39.05 kg/m  General: Alert, oriented, no acute distress. HEENT: Normocephalic, atraumatic, sclera anicteric. Chest: Unlabored breathing on room air. Abdomen: Obese, soft, nontender.  Normoactive bowel sounds.  No masses or hepatosplenomegaly appreciated.   Extremities: Grossly normal range of motion.  Warm, well perfused.  No edema bilaterally. Skin: No rashes or lesions noted. Lymphatics: No cervical, supraclavicular, or inguinal adenopathy. GU: Normal appearing external genitalia without erythema, excoriation, or lesions.  Speculum exam reveals normal-appearing cervix, no lesions or masses.  Bimanual exam reveals small, mobile, anteverted uterus.    Mirena IUD insertion procedure Preoperative diagnosis: EIN Postoperative diagnosis: Same as above Physician: Pricilla Holm MD Estimated blood loss: Minimal Specimens: none Procedure: After the procedure was discussed with the patient including risks and benefits, she gave verbal consent.  She was then placed in dorsolithotomy position and a speculum was placed in the vagina.  Once the cervix was well visualized it was cleansed with Betadine x3.  An endometrial Pipelle was then passed to a depth of just under 8 cm to sounds the uterus.  Mirena IUD (Lot # [id number], Exp 09/2024) was then inserted to the fundus, deployed, and the inserted removed.  IUD strings were cut at 3 cm.  Overall the patient tolerated the procedure well.  All instruments were removed from the vagina.  Laboratory & Radiologic Studies: UPT negative today  Assessment & Plan: Christine Johnston is a 41 y.o. woman with EIN who presents for Mirena IUD insertion today.   IUD inserted without difficulty.  Patient has been previously counseled about plan for monitoring.  I will plan to see her back in 3 months for repeat biopsy.   Patient is due for cervical cancer  screening. We will plan to perform a Pap and HPV at her follow-up visit with me in 3 months.  22 minutes of total time was spent for this patient encounter, including preparation, face-to-face counseling with the patient and coordination of care, and documentation of the encounter.  Eugene Garnet, MD  Division of Gynecologic Oncology  Department of Obstetrics and Gynecology  [facility]

## 2022-12-21 ENCOUNTER — Encounter: Payer: Self-pay | Admitting: Gynecologic Oncology

## 2022-12-22 NOTE — Progress Notes (Signed)
Gynecologic Oncology Return Clinic Visit  1/19.24  Reason for Visit: follow-up in the setting of EIN, biopsy  Treatment History: Patient was initially seen in mid August for infertility in the setting of a history of polycystic ovarian syndrome.  She reports having 5 periods a year.  She had previously received infertility treatment and undergone IVF cycles.  Unfortunately, she had to embryo transfers that did not result in a pregnancy.   Pelvic ultrasound on 07/18/2022 showed a uterus measuring 6.6 x 3.6 x 4.9 cm.  Endometrium measured 7.3 mm and had a microcystic structure.  Bilateral ovaries normal in size with antral follicle counts of 50, consistent with a polycystic morphology.   08/16/2022: Seen for a saline sonohysterogram and trial transfer in preparation for an upcoming IVF cycle.  Findings from this procedure included a normal-appearing uterine cavity without evidence of uterine polyps, submucosal fibroids, or intrauterine adhesions.  Endometrial biopsy was taken and shows fragmented proliferative endometrium with atypia consistent with complex atypical hyperplasia.  10/04/22: Mirena IUD placed.  Interval History: Doing well.  Had a menses last month that was about normal quantity and duration for her.  Past Medical/Surgical History: Past Medical History:  Diagnosis Date   PCOS (polycystic ovarian syndrome)    PTSD (post-traumatic stress disorder)     Past Surgical History:  Procedure Laterality Date   KNEE ARTHROSCOPY Left 12/05/2008   SPINAL CORD STIMULATOR INSERTION N/A 02/19/2016   Procedure: CERVICAL SPINAL CORD STIMULATOR INSERTION;  Surgeon: Clydell Hakim, MD;  Location: [place] [facility];  Service: Neurosurgery;  Laterality: N/A;   transvaginal oocyte retrieval  2020   2021    Family History  Problem Relation Age of Onset   Cervical cancer Sister        age 9   Leukemia Maternal Grandmother    Breast cancer Paternal Grandmother    Cancer - Ovarian Paternal  Great-grandmother    Endometrial cancer Neg Hx    Prostate cancer Neg Hx    Pancreatic cancer Neg Hx     Social History   Socioeconomic History   Marital status: Single    Spouse name: Not on file   Number of children: Not on file   Years of education: Not on file   Highest education level: Not on file  Occupational History   Not on file  Tobacco Use   Smoking status: Former    Packs/day: 0.50    Years: 10.00    Total pack years: 5.00    Types: Cigarettes   Smokeless tobacco: Never  Substance and Sexual Activity   Alcohol use: No   Drug use: No   Sexual activity: Yes  Other Topics Concern   Not on file  Social History Narrative   Not on file   Social Determinants of Health   Financial Resource Strain: Not on file  Food Insecurity: Not on file  Transportation Needs: Not on file  Physical Activity: Not on file  Stress: Not on file  Social Connections: Not on file    Current Medications:  Current Outpatient Medications:    metFORMIN (GLUCOPHAGE) 500 MG tablet, Take by mouth., Disp: , Rfl:   Review of Systems: Denies appetite changes, fevers, chills, fatigue, unexplained weight changes. Denies hearing loss, neck lumps or masses, mouth sores, ringing in ears or voice changes. Denies cough or wheezing.  Denies shortness of breath. Denies chest pain or palpitations. Denies leg swelling. Denies abdominal distention, pain, blood in stools, constipation, diarrhea, nausea, vomiting, or early satiety. Denies  pain with intercourse, dysuria, frequency, hematuria or incontinence. Denies hot flashes, pelvic pain, vaginal bleeding or vaginal discharge.   Denies joint pain, back pain or muscle pain/cramps. Denies itching, rash, or wounds. Denies dizziness, headaches, numbness or seizures. Denies swollen lymph nodes or glands, denies easy bruising or bleeding. Denies anxiety, depression, confusion, or decreased concentration.  Physical Exam: BP 139/84 (BP Location: Right  Arm, Patient Position: Sitting)   Pulse 75   Temp 98.4 F (36.9 C) (Oral)   Resp 14   Wt 284 lb 6.4 oz (129 kg)   SpO2 98%   BMI 39.60 kg/m  General: Alert, oriented, no acute distress. HEENT: Normocephalic, atraumatic, sclera anicteric. Chest: Unlabored breathing on room air. GU: Normal-appearing external female genitalia.  Speculum exam reveals normal-appearing cervix.  IUD strings protruding approximately 3-4 cm from the cervical os.  Minimal older appearing blood within the apex of the vaginal vault.  Endometrial biopsy procedure Preoperative diagnosis: EIN, undergoing treatment with Mirena IUD Postoperative diagnosis: Same as above Physician: Berline Lopes MD Estimated blood loss: Minimal Specimens: Endometrial biopsy Procedure: After the procedure was discussed with the patient including risks and benefits, she gave verbal consent.  Pregnancy test was offered.  Patient declined as she states she could not be pregnant.  She was then placed in dorsolithotomy position and a speculum was placed in the vagina.  Once the cervix was well visualized it was cleansed with Betadine x3.  An endometrial Pipelle was then passed to a depth of just over 8 cm.  1 pass was performed with sufficient tissue obtained.  This was placed in formalin.  Overall the patient tolerated the procedure well.  All instruments were removed from the vagina.  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  Patient is overall doing well.  Discussed that continued menses as well as some breakthrough spotting/bleeding are normal in the setting of Mirena IUD.  Biopsy performed today without difficulty.  I will let her know when I get the biopsy results back next week.  We will plan on repeat biopsy and clinic visit in 3 months.  Patient is due for a Pap test.  Unfortunately, Betadine was placed on the cervix before this was remembered  again today.  Will plan to get a Pap at her next visit.  20 minutes of total time was spent for this patient encounter, including preparation, face-to-face counseling with the patient and coordination of care, and documentation of the encounter.  Jeral Pinch, MD  Division of Gynecologic Oncology  Department of Obstetrics and Gynecology  [facility]

## 2022-12-23 ENCOUNTER — Inpatient Hospital Stay: Payer: BC Managed Care – PPO | Attending: Gynecologic Oncology | Admitting: Gynecologic Oncology

## 2022-12-23 ENCOUNTER — Encounter: Payer: Self-pay | Admitting: Gynecologic Oncology

## 2022-12-23 ENCOUNTER — Other Ambulatory Visit: Payer: Self-pay

## 2022-12-23 VITALS — BP 139/84 | HR 75 | Temp 98.4°F | Resp 14 | Wt 284.4 lb

## 2022-12-23 DIAGNOSIS — N8502 Endometrial intraepithelial neoplasia [EIN]: Secondary | ICD-10-CM | POA: Insufficient documentation

## 2022-12-23 DIAGNOSIS — Z79890 Hormone replacement therapy (postmenopausal): Secondary | ICD-10-CM | POA: Insufficient documentation

## 2022-12-23 DIAGNOSIS — N970 Female infertility associated with anovulation: Secondary | ICD-10-CM | POA: Diagnosis not present

## 2022-12-23 DIAGNOSIS — Z975 Presence of (intrauterine) contraceptive device: Secondary | ICD-10-CM | POA: Insufficient documentation

## 2022-12-23 DIAGNOSIS — E282 Polycystic ovarian syndrome: Secondary | ICD-10-CM | POA: Insufficient documentation

## 2022-12-23 DIAGNOSIS — E669 Obesity, unspecified: Secondary | ICD-10-CM

## 2022-12-23 NOTE — Patient Instructions (Signed)
It was good to see you today.  I will see you in 3 months.  I will let you know biopsy results once back next week.  Please call if you have any changes related to bleeding or pelvic pain.

## 2022-12-28 LAB — SURGICAL PATHOLOGY

## 2023-01-02 ENCOUNTER — Encounter: Payer: Self-pay | Admitting: Gynecologic Oncology

## 2023-01-02 NOTE — Progress Notes (Signed)
Called patient. Discussed biopsy results. Plan for repeat biopsy in 3 months.  Valarie Cones MD

## 2023-03-23 NOTE — Progress Notes (Signed)
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  She reports having 5 periods a year.  She had previously received infertility treatment and undergone IVF cycles.  Unfortunately, she had to embryo transfers that did not result in a pregnancy.   Pelvic ultrasound on 07/18/2022 showed a uterus measuring 6.6 x 3.6 x 4.9 cm.  Endometrium measured 7.3 mm and had a microcystic structure.  Bilateral ovaries normal in size with antral follicle counts of 50, consistent with a polycystic morphology.   08/16/2022: Seen for a saline sonohysterogram and trial transfer in preparation for an upcoming IVF cycle.  Findings from this procedure included a normal-appearing uterine cavity without evidence of uterine polyps, submucosal fibroids, or intrauterine adhesions.  Endometrial biopsy was taken and shows fragmented proliferative endometrium with atypia consistent with complex atypical hyperplasia.   10/04/22: Mirena IUD placed.  12/23/22: EMB - focal, simple hyperplasia with squamous metaplasia in the background of marked progesterone effect, no atypia or malignancy.  Interval History: Doing well.  Denies any vaginal bleeding, pelvic pain or cramping.  Reports baseline bowel bladder function.  Past Medical/Surgical History: Past Medical History:  Diagnosis Date   PCOS (polycystic ovarian syndrome)    PTSD (post-traumatic stress disorder)     Past Surgical History:  Procedure Laterality Date   KNEE ARTHROSCOPY Left 12/05/2008   SPINAL CORD STIMULATOR INSERTION N/A 02/19/2016   Procedure: CERVICAL SPINAL CORD STIMULATOR INSERTION;  Surgeon: Odette Fraction, MD;  Location: [facility];  Service: Neurosurgery;  Laterality: N/A;   transvaginal oocyte retrieval  2020   2021    Family History  Problem Relation  Age of Onset   Cervical cancer Sister        age 24   Leukemia Maternal Grandmother    Breast cancer Paternal Grandmother    Cancer - Ovarian Paternal Great-grandmother    Endometrial cancer Neg Hx    Prostate cancer Neg Hx    Pancreatic cancer Neg Hx     Social History   Socioeconomic History   Marital status: Single    Spouse name: Not on file   Number of children: Not on file   Years of education: Not on file   Highest education level: Not on file  Occupational History   Not on file  Tobacco Use   Smoking status: Former    Packs/day: 0.50    Years: 10.00    Additional pack years: 0.00    Total pack years: 5.00    Types: Cigarettes   Smokeless tobacco: Never  Substance and Sexual Activity   Alcohol use: No   Drug use: No   Sexual activity: Yes  Other Topics Concern   Not on file  Social History Narrative   Not on file   Social Determinants of Health   Financial Resource Strain: Not on file  Food Insecurity: Not on file  Transportation Needs: Not on file  Physical Activity: Not on file  Stress: Not on file  Social Connections: Not on file    Current Medications:  Current Outpatient Medications:    metFORMIN (GLUCOPHAGE) 500 MG tablet, Take by mouth., Disp: , Rfl:   Review of Systems: Denies appetite changes, fevers, chills, fatigue, unexplained weight changes. Denies hearing loss, neck lumps or masses, mouth sores, ringing in ears or voice changes. Denies cough  or wheezing.  Denies shortness of breath. Denies chest pain or palpitations. Denies leg swelling. Denies abdominal distention, pain, blood in stools, constipation, diarrhea, nausea, vomiting, or early satiety. Denies pain with intercourse, dysuria, frequency, hematuria or incontinence. Denies hot flashes, pelvic pain, vaginal bleeding or vaginal discharge.   Denies joint pain, back pain or muscle pain/cramps. Denies itching, rash, or wounds. Denies dizziness, headaches, numbness or  seizures. Denies swollen lymph nodes or glands, denies easy bruising or bleeding. Denies anxiety, depression, confusion, or decreased concentration.  Physical Exam: BP (!) 143/68 (BP Location: Right Arm, Patient Position: Sitting)   Pulse 75   Temp 98.2 F (36.8 C) (Oral)   Wt 275 lb 3.2 oz (124.8 kg)   SpO2 98%   BMI 38.31 kg/m  General: Alert, oriented, no acute distress. HEENT: Normocephalic, atraumatic, sclera anicteric. Chest: Unlabored breathing on room air.  Lungs clear to auscultation bilaterally. Cardiovascular: Regular rate and rhythm, no murmurs, rubs, or gallops appreciated. GU: Normal-appearing external female genitalia.  Speculum exam reveals normal-appearing cervix.  IUD strings protruding approximately 3-4 cm from the cervical os.  Pap and HPV testing collected.   Endometrial biopsy procedure Preoperative diagnosis: EIN, undergoing treatment with Mirena IUD Postoperative diagnosis: Same as above Physician: Pricilla Holm MD Estimated blood loss: Minimal Specimens: Endometrial biopsy Procedure: After the procedure was discussed with the patient including risks and benefits, she gave verbal consent.  Pregnancy test was offered.  Patient declined as she states she could not be pregnant.  She was then placed in dorsolithotomy position and a speculum was placed in the vagina.  Once the cervix was well visualized it was cleansed with Betadine x3.  Single-tooth tenaculum was placed on the anterior lip of the cervix.  An endometrial Pipelle was then passed to a depth of just under 8 cm.  1 pass was performed with sufficient tissue obtained.  This was placed in formalin.  Overall the patient tolerated the procedure well.  All instruments were removed from the vagina.  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   Patient is overall doing well.  She has not had any breakthrough bleeding since  her last visit with me.  Last biopsy showed simple hyperplasia without atypia or malignancy.  Repeat biopsy performed today.  We discussed 3 negative biopsies prior to consideration of removal of the IUD or spacing out biopsies.  I would consider her last biopsy showing simple hyperplasia a negative one.   Cancer screening performed today with Pap and HPV.  I will see her for follow-up in 3 months with a repeat endometrial biopsy at that time.  She was congratulated on weight loss since her last visit, totaling almost 10 pounds.  22 minutes of total time was spent for this patient encounter, including preparation, face-to-face counseling with the patient and coordination of care, and documentation of the encounter.  Eugene Garnet, MD  Division of Gynecologic Oncology  Department of Obstetrics and Gynecology  [facility]

## 2023-03-24 ENCOUNTER — Encounter: Payer: Self-pay | Admitting: Gynecologic Oncology

## 2023-03-24 ENCOUNTER — Inpatient Hospital Stay: Payer: BC Managed Care – PPO | Attending: Gynecologic Oncology | Admitting: Gynecologic Oncology

## 2023-03-24 ENCOUNTER — Other Ambulatory Visit: Payer: Self-pay

## 2023-03-24 ENCOUNTER — Other Ambulatory Visit (HOSPITAL_COMMUNITY)
Admission: RE | Admit: 2023-03-24 | Discharge: 2023-03-24 | Disposition: A | Discharge status: Home / Self Care | Payer: Non-veteran care | Source: Ambulatory Visit | Attending: Gynecologic Oncology | Admitting: Gynecologic Oncology

## 2023-03-24 VITALS — BP 143/68 | HR 75 | Temp 98.2°F | Wt 275.2 lb

## 2023-03-24 DIAGNOSIS — N8502 Endometrial intraepithelial neoplasia [EIN]: Secondary | ICD-10-CM | POA: Diagnosis not present

## 2023-03-24 DIAGNOSIS — Z124 Encounter for screening for malignant neoplasm of cervix: Secondary | ICD-10-CM | POA: Insufficient documentation

## 2023-03-24 DIAGNOSIS — Z79890 Hormone replacement therapy (postmenopausal): Secondary | ICD-10-CM | POA: Insufficient documentation

## 2023-03-24 DIAGNOSIS — E282 Polycystic ovarian syndrome: Secondary | ICD-10-CM | POA: Insufficient documentation

## 2023-03-24 DIAGNOSIS — Z975 Presence of (intrauterine) contraceptive device: Secondary | ICD-10-CM | POA: Insufficient documentation

## 2023-03-24 NOTE — Patient Instructions (Signed)
It was good to see you today.  I will let you know your biopsy results once back next week.  Will plan on a repeat visit with a biopsy in 3 months.  Our goal is to get 3 negative biopsies and then we can start spacing out biopsies or consider taking out the IUD if you are ready to try to get pregnant.

## 2023-03-30 LAB — CYTOLOGY - PAP
Comment: NEGATIVE
Diagnosis: UNDETERMINED — AB
High risk HPV: NEGATIVE

## 2023-04-03 LAB — SURGICAL PATHOLOGY

## 2023-06-30 ENCOUNTER — Other Ambulatory Visit: Payer: Self-pay

## 2023-06-30 ENCOUNTER — Encounter: Payer: Self-pay | Admitting: Gynecologic Oncology

## 2023-06-30 ENCOUNTER — Inpatient Hospital Stay: Payer: Non-veteran care

## 2023-06-30 ENCOUNTER — Inpatient Hospital Stay: Payer: BC Managed Care – PPO | Attending: Gynecologic Oncology | Admitting: Gynecologic Oncology

## 2023-06-30 VITALS — BP 142/72 | HR 67 | Temp 98.1°F | Resp 20 | Ht 71.0 in | Wt 284.6 lb

## 2023-06-30 DIAGNOSIS — N8502 Endometrial intraepithelial neoplasia [EIN]: Secondary | ICD-10-CM

## 2023-06-30 LAB — PREGNANCY, URINE: Preg Test, Ur: NEGATIVE

## 2023-06-30 NOTE — Patient Instructions (Signed)
It was good to see you today.  I will let you know when I get your biopsy results back next week.  We will tentatively plan on a follow-up in 3 months.

## 2023-06-30 NOTE — Progress Notes (Signed)
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  She reports having 5 periods a year.  She had previously received infertility treatment and undergone IVF cycles.  Unfortunately, she had to embryo transfers that did not result in a pregnancy.   Pelvic ultrasound on 07/18/2022 showed a uterus measuring 6.6 x 3.6 x 4.9 cm.  Endometrium measured 7.3 mm and had a microcystic structure.  Bilateral ovaries normal in size with antral follicle counts of 50, consistent with a polycystic morphology.   08/16/2022: Seen for a saline sonohysterogram and trial transfer in preparation for an upcoming IVF cycle.  Findings from this procedure included a normal-appearing uterine cavity without evidence of uterine polyps, submucosal fibroids, or intrauterine adhesions.  Endometrial biopsy was taken and shows fragmented proliferative endometrium with atypia consistent with complex atypical hyperplasia.   10/04/22: Mirena IUD placed.   12/23/22: EMB - focal, simple hyperplasia with squamous metaplasia in the background of marked progesterone effect, no atypia or malignancy.  03/24/23: EMB - focal, simple hyperplasia with squamous metaplasia in the background of marked progesterone effect, no atypia or malignancy. Pap ASCUS, HR HPV negative.  Interval History: Doing well.  Denies any vaginal bleeding except after her last biopsy.  Denies any pelvic pain or cramping.  Still working on diet changes and exercise, somewhat frustrated by up-and-down weight changes.  Past Medical/Surgical History: Past Medical History:  Diagnosis Date   PCOS (polycystic ovarian syndrome)    PTSD (post-traumatic stress disorder)     Past Surgical History:  Procedure Laterality Date   KNEE ARTHROSCOPY Left 12/05/2008   SPINAL CORD  STIMULATOR INSERTION N/A 02/19/2016   Procedure: CERVICAL SPINAL CORD STIMULATOR INSERTION;  Surgeon: Odette Fraction, MD;  Location: [facility];  Service: Neurosurgery;  Laterality: N/A;   transvaginal oocyte retrieval  2020   2021    Family History  Problem Relation Age of Onset   Cervical cancer Sister        age 73   Leukemia Maternal Grandmother    Breast cancer Paternal Grandmother    Cancer - Ovarian Paternal Great-grandmother    Endometrial cancer Neg Hx    Prostate cancer Neg Hx    Pancreatic cancer Neg Hx     Social History   Socioeconomic History   Marital status: Single    Spouse name: Not on file   Number of children: Not on file   Years of education: Not on file   Highest education level: Not on file  Occupational History   Not on file  Tobacco Use   Smoking status: Former    Current packs/day: 0.50    Average packs/day: 0.5 packs/day for 10.0 years (5.0 ttl pk-yrs)    Types: Cigarettes   Smokeless tobacco: Never  Substance and Sexual Activity   Alcohol use: No   Drug use: No   Sexual activity: Yes  Other Topics Concern   Not on file  Social History Narrative   Not on file   Social Determinants of Health   Financial Resource Strain: Not on file  Food Insecurity: Not on file  Transportation Needs: Not on file  Physical Activity: Not on file  Stress: Not on file  Social Connections: Unknown (04/17/2022)   Received from [facility]   Social Network    Social Network: Not on  file    Current Medications:  Current Outpatient Medications:    metFORMIN (GLUCOPHAGE) 500 MG tablet, Take by mouth., Disp: , Rfl:   Review of Systems: Denies appetite changes, fevers, chills, fatigue, unexplained weight changes. Denies hearing loss, neck lumps or masses, mouth sores, ringing in ears or voice changes. Denies cough or wheezing.  Denies shortness of breath. Denies chest pain or palpitations. Denies leg swelling. Denies abdominal distention, pain, blood in  stools, constipation, diarrhea, nausea, vomiting, or early satiety. Denies pain with intercourse, dysuria, frequency, hematuria or incontinence. Denies hot flashes, pelvic pain, vaginal bleeding or vaginal discharge.   Denies joint pain, back pain or muscle pain/cramps. Denies itching, rash, or wounds. Denies dizziness, headaches, numbness or seizures. Denies swollen lymph nodes or glands, denies easy bruising or bleeding. Denies anxiety, depression, confusion, or decreased concentration.  Physical Exam: BP (!) 142/72   Pulse 67   Temp 98.1 F (36.7 C)   Resp 20   Ht 5\' 11"  (1.803 m)   Wt 284 lb 9.6 oz (129.1 kg)   SpO2 99%   BMI 39.69 kg/m  General: Alert, oriented, no acute distress. HEENT: Normocephalic, atraumatic, sclera anicteric. Chest: Unlabored breathing on room air.  Lungs clear to auscultation bilaterally. Cardiovascular: Regular rate and rhythm, no murmurs, rubs, or gallops appreciated. GU: Normal-appearing external female genitalia.  Speculum exam reveals normal-appearing cervix.  IUD strings protruding just barely from the external os.  These were grasped with a ring forceps and gently manipulated so closer to 2-3 cm protruding from the cervical os.  Endometrial biopsy procedure Preoperative diagnosis: EIN, undergoing treatment with Mirena IUD Postoperative diagnosis: Same as above Physician: Pricilla Holm MD Estimated blood loss: Minimal Specimens: Endometrial biopsy Procedure: After the procedure was discussed with the patient including risks and benefits, she gave verbal consent.  Pregnancy test was offered.  Patient declined as she states she could not be pregnant.  She was then placed in dorsolithotomy position and a speculum was placed in the vagina.  Once the cervix was well visualized it was cleansed with Betadine x3.  Single-tooth tenaculum was placed on the anterior lip of the cervix.  An endometrial Pipelle was then passed to a depth of just under 8 cm.  1 pass was  performed with sufficient tissue obtained.  This was placed in formalin.  Overall the patient tolerated the procedure well.  All instruments were removed from the vagina.  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   Patient is overall doing well.  She has not had any breakthrough bleeding since her last visit with me.  Last biopsy showed simple hyperplasia without atypia or malignancy.  Repeat biopsy performed today.  We discussed 3 negative biopsies prior to consideration of removal of the IUD or spacing out biopsies.  Her last 2 biopsies were simple hyperplasia without atypia.  If this biopsy continues to be simple hyperplasia without atypia or normal, I think it would be reasonable to consider taking out the IUD at any point in the future when she is ready to move forward with pregnancy.  I strongly recommended keeping the IUD in the low until she is ready to actively start trying.  20 minutes of total time was spent for this patient encounter, including preparation, face-to-face counseling with the patient and coordination of care, and documentation of the encounter.  Eugene Garnet, MD  Division of  Gynecologic Oncology  Department of Obstetrics and Gynecology  [facility]

## 2023-08-08 ENCOUNTER — Telehealth: Payer: Self-pay

## 2023-08-08 NOTE — Telephone Encounter (Signed)
Pt calls office stating she has an appointment on 10/24 with Dr. Pricilla Holm for IUD removal. She is wanting to know if she can come in sooner d/t they are doing IVF and the sooner the IUD is removed the better.   I looked at Dr. Winferd Humphrey schedule, the next available isn't until 10/10 @ 4:00. She is keeping the original appt.    Pt aware we will put her on a cancellation list, if anything sooner becomes available.

## 2023-09-27 NOTE — Progress Notes (Unsigned)
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  She reports having 5 periods a year.  She had previously received infertility treatment and undergone IVF cycles.  Unfortunately, she had to embryo transfers that did not result in a pregnancy.   Pelvic ultrasound on 07/18/2022 showed a uterus measuring 6.6 x 3.6 x 4.9 cm.  Endometrium measured 7.3 mm and had a microcystic structure.  Bilateral ovaries normal in size with antral follicle counts of 50, consistent with a polycystic morphology.   08/16/2022: Seen for a saline sonohysterogram and trial transfer in preparation for an upcoming IVF cycle.  Findings from this procedure included a normal-appearing uterine cavity without evidence of uterine polyps, submucosal fibroids, or intrauterine adhesions.  Endometrial biopsy was taken and shows fragmented proliferative endometrium with atypia consistent with complex atypical hyperplasia.   10/04/22: Mirena IUD placed.   12/23/22: EMB - focal, simple hyperplasia with squamous metaplasia in the background of marked progesterone effect, no atypia or malignancy.   03/24/23: EMB - focal, simple hyperplasia with squamous metaplasia in the background of marked progesterone effect, no atypia or malignancy. Pap ASCUS, HR HPV negative.  06/30/23: EMB-fragments of benign endometrium with stromal pseudodecidualization, consistent with hormone effect.  No hyperplasia or malignancy.  Interval History: Doing well.  Had a light menstrual cycle last month.  Denies any intermenstrual spotting.  Seems to have a cycle about every other month.  Denies any abdominal or pelvic pain.  Past Medical/Surgical History: Past Medical History:  Diagnosis Date   PCOS (polycystic ovarian syndrome)    PTSD (post-traumatic stress  disorder)     Past Surgical History:  Procedure Laterality Date   KNEE ARTHROSCOPY Left 12/05/2008   SPINAL CORD STIMULATOR INSERTION N/A 02/19/2016   Procedure: CERVICAL SPINAL CORD STIMULATOR INSERTION;  Surgeon: Odette Fraction, MD;  Location: [facility];  Service: Neurosurgery;  Laterality: N/A;   transvaginal oocyte retrieval  2020   2021    Family History  Problem Relation Age of Onset   Cervical cancer Sister        age 75   Leukemia Maternal Grandmother    Breast cancer Paternal Grandmother    Cancer - Ovarian Paternal Great-grandmother    Endometrial cancer Neg Hx    Prostate cancer Neg Hx    Pancreatic cancer Neg Hx     Social History   Socioeconomic History   Marital status: Single    Spouse name: Not on file   Number of children: Not on file   Years of education: Not on file   Highest education level: Not on file  Occupational History   Not on file  Tobacco Use   Smoking status: Former    Current packs/day: 0.50    Average packs/day: 0.5 packs/day for 10.0 years (5.0 ttl pk-yrs)    Types: Cigarettes   Smokeless tobacco: Never  Substance and Sexual Activity   Alcohol use: No   Drug use: No   Sexual activity: Yes  Other Topics Concern   Not on file  Social History Narrative   Not on file   Social Determinants of Health   Financial Resource Strain: Not on file  Food Insecurity: Not on file  Transportation Needs: Not on file  Physical Activity: Not on file  Stress: Not on file  Social Connections: Unknown (  04/17/2022)   Received from [facility]   Social Network    Social Network: Not on file    Current Medications:  Current Outpatient Medications:    metFORMIN (GLUCOPHAGE) 500 MG tablet, Take by mouth., Disp: , Rfl:   Review of Systems: Denies appetite changes, fevers, chills, fatigue, unexplained weight changes. Denies hearing loss, neck lumps or masses, mouth sores, ringing in ears or voice changes. Denies cough or  wheezing.  Denies shortness of breath. Denies chest pain or palpitations. Denies leg swelling. Denies abdominal distention, pain, blood in stools, constipation, diarrhea, nausea, vomiting, or early satiety. Denies pain with intercourse, dysuria, frequency, hematuria or incontinence. Denies hot flashes, pelvic pain, vaginal bleeding or vaginal discharge.   Denies joint pain, back pain or muscle pain/cramps. Denies itching, rash, or wounds. Denies dizziness, headaches, numbness or seizures. Denies swollen lymph nodes or glands, denies easy bruising or bleeding. Denies anxiety, depression, confusion, or decreased concentration.  Physical Exam: BP (!) 141/85 (BP Location: Right Arm, Patient Position: Sitting) Comment: Recheck first was 143/82  Pulse 86   Temp 98.1 F (36.7 C) (Oral)   Resp 19   Wt 273 lb 9.6 oz (124.1 kg)   SpO2 98%   BMI 38.16 kg/m  General: Alert, oriented, no acute distress. HEENT: Normocephalic, atraumatic, sclera anicteric. Chest: unlabored breathing on room air.  GU: Normal-appearing external female genitalia.  Speculum exam reveals normal-appearing cervix.  Strings not seen.  Cytobrush was placed in the cervix with an attempt to manipulate strings if they were in the cervical os, no strings appreciated.   Endometrial biopsy procedure Preoperative diagnosis: EIN, undergoing treatment with Mirena IUD Postoperative diagnosis: Same as above Physician: Pricilla Holm MD Estimated blood loss: Minimal Specimens: Endometrial biopsy Procedure: After the procedure was discussed with the patient including risks and benefits, she gave verbal consent. She was then placed in dorsolithotomy position and a speculum was placed in the vagina.  Once the cervix was well visualized it was cleansed with Betadine x3.  Single-tooth tenaculum was placed on the anterior lip of the cervix.  An endometrial Pipelle was then passed to a depth of just under 7 cm.  1 pass was performed with sufficient  tissue obtained.  This was placed in formalin.  Overall the patient tolerated the procedure well.  All instruments were removed from the vagina.  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   Patient is overall doing well.  She had 1 breakthrough light menstrual cycle since I last saw her.  Unfortunately, IUD strings were not visible on exam today.  I used a Cytobrush to try to tease them out of the cervix but was not successful.  Endometrial biopsy performed as noted above.  Plan to get abdominal/pelvic x-ray.  If this confirms IUD is in the pelvis, we will get an ultrasound to assure this still in the uterus.  If IUD not seen in the pelvis, then we can presume that it fell out and she would be ready to get started with IVF as soon as she is ready.  Addendum: X-ray reviewed by me, IUD seen in the pelvis.  I have placed an order to get the patient scheduled for a pelvic ultrasound as soon as possible, hopefully next week.  If this confirms IUD is in the uterus, we will have her come back to clinic soon to try to remove the IUD using  polyp forceps.  If this is unsuccessful, we will have to do something under some sedation in the OR.  22 minutes of total time was spent for this patient encounter, including preparation, face-to-face counseling with the patient and coordination of care, and documentation of the encounter.  Eugene Garnet, MD  Division of Gynecologic Oncology  Department of Obstetrics and Gynecology  [facility]

## 2023-09-28 ENCOUNTER — Encounter: Payer: Self-pay | Admitting: Gynecologic Oncology

## 2023-09-28 ENCOUNTER — Inpatient Hospital Stay: Payer: BC Managed Care – PPO | Attending: Gynecologic Oncology | Admitting: Gynecologic Oncology

## 2023-09-28 ENCOUNTER — Ambulatory Visit (HOSPITAL_COMMUNITY)
Admission: RE | Admit: 2023-09-28 | Discharge: 2023-09-28 | Disposition: A | Discharge status: Home / Self Care | Payer: BC Managed Care – PPO | Source: Ambulatory Visit | Attending: Gynecologic Oncology | Admitting: Gynecologic Oncology

## 2023-09-28 VITALS — BP 141/85 | HR 86 | Temp 98.1°F | Resp 19 | Wt 273.6 lb

## 2023-09-28 DIAGNOSIS — T8332XA Displacement of intrauterine contraceptive device, initial encounter: Secondary | ICD-10-CM | POA: Insufficient documentation

## 2023-09-28 DIAGNOSIS — N8502 Endometrial intraepithelial neoplasia [EIN]: Secondary | ICD-10-CM

## 2023-09-28 DIAGNOSIS — Z975 Presence of (intrauterine) contraceptive device: Secondary | ICD-10-CM | POA: Diagnosis not present

## 2023-09-28 NOTE — Patient Instructions (Signed)
It was good to see you today.  I will let you know biopsy results once back.  We will get an x-ray and possibly an ultrasound to figure out if the IUD is still in your uterus or if it has already fallen out.

## 2023-09-29 ENCOUNTER — Telehealth: Payer: Self-pay | Admitting: *Deleted

## 2023-09-29 NOTE — Telephone Encounter (Signed)
Per Dr Pricilla Holm scheduled the patient for an [place] on 10/31 at 10 am at [facility] LMOM for the patient to call the office back

## 2023-10-02 LAB — SURGICAL PATHOLOGY

## 2023-10-02 NOTE — Telephone Encounter (Signed)
LMOM with the appt time/date for [place]. Ask the patient to call the office back to confirm she received the message

## 2023-10-04 NOTE — Telephone Encounter (Signed)
LMOM with the appt time/date for [place]. Ask the patient to call the office back to confirm she received the message

## 2023-10-05 ENCOUNTER — Ambulatory Visit: Payer: BC Managed Care – PPO

## 2023-10-05 DIAGNOSIS — D259 Leiomyoma of uterus, unspecified: Secondary | ICD-10-CM | POA: Diagnosis not present

## 2023-10-05 DIAGNOSIS — T8332XA Displacement of intrauterine contraceptive device, initial encounter: Secondary | ICD-10-CM

## 2023-10-05 DIAGNOSIS — N8502 Endometrial intraepithelial neoplasia [EIN]: Secondary | ICD-10-CM

## 2023-10-05 DIAGNOSIS — Z30431 Encounter for routine checking of intrauterine contraceptive device: Secondary | ICD-10-CM

## 2023-10-06 ENCOUNTER — Encounter: Payer: Self-pay | Admitting: Gynecologic Oncology

## 2023-10-06 ENCOUNTER — Inpatient Hospital Stay: Payer: BC Managed Care – PPO | Attending: Gynecologic Oncology | Admitting: Gynecologic Oncology

## 2023-10-06 VITALS — BP 131/71 | HR 72 | Temp 98.6°F | Resp 20 | Wt 273.0 lb

## 2023-10-06 DIAGNOSIS — N8502 Endometrial intraepithelial neoplasia [EIN]: Secondary | ICD-10-CM | POA: Diagnosis present

## 2023-10-06 DIAGNOSIS — T8332XD Displacement of intrauterine contraceptive device, subsequent encounter: Secondary | ICD-10-CM | POA: Insufficient documentation

## 2023-10-06 DIAGNOSIS — E282 Polycystic ovarian syndrome: Secondary | ICD-10-CM | POA: Insufficient documentation

## 2023-10-06 NOTE — Progress Notes (Signed)
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  She reports having 5 periods a year.  She had previously received infertility treatment and undergone IVF cycles.  Unfortunately, she had to embryo transfers that did not result in a pregnancy.   Pelvic ultrasound on 07/18/2022 showed a uterus measuring 6.6 x 3.6 x 4.9 cm.  Endometrium measured 7.3 mm and had a microcystic structure.  Bilateral ovaries normal in size with antral follicle counts of 50, consistent with a polycystic morphology.   08/16/2022: Seen for a saline sonohysterogram and trial transfer in preparation for an upcoming IVF cycle.  Findings from this procedure included a normal-appearing uterine cavity without evidence of uterine polyps, submucosal fibroids, or intrauterine adhesions.  Endometrial biopsy was taken and shows fragmented proliferative endometrium with atypia consistent with complex atypical hyperplasia.   10/04/22: Mirena IUD placed.   12/23/22: EMB - focal, simple hyperplasia with squamous metaplasia in the background of marked progesterone effect, no atypia or malignancy.   03/24/23: EMB - focal, simple hyperplasia with squamous metaplasia in the background of marked progesterone effect, no atypia or malignancy. Pap ASCUS, HR HPV negative.   06/30/23: EMB-fragments of benign endometrium with stromal pseudodecidualization, consistent with hormone effect.  No hyperplasia or malignancy.  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  She reports having 5 periods a year.  She had previously received infertility treatment and undergone IVF cycles.  Unfortunately, she had to embryo  transfers that did not result in a pregnancy.   Pelvic ultrasound on 07/18/2022 showed a uterus measuring 6.6 x 3.6 x 4.9 cm.  Endometrium measured 7.3 mm and had a microcystic structure.  Bilateral ovaries normal in size with antral follicle counts of 50, consistent with a polycystic morphology.   08/16/2022: Seen for a saline sonohysterogram and trial transfer in preparation for an upcoming IVF cycle.  Findings from this procedure included a normal-appearing uterine cavity without evidence of uterine polyps, submucosal fibroids, or intrauterine adhesions.  Endometrial biopsy was taken and shows fragmented proliferative endometrium with atypia consistent with complex atypical hyperplasia.   10/04/22: Mirena IUD placed.   12/23/22: EMB - focal, simple hyperplasia with squamous metaplasia in the background of marked progesterone effect, no atypia or malignancy.   03/24/23: EMB - focal, simple hyperplasia with squamous metaplasia in the background of marked progesterone effect, no atypia or malignancy. Pap ASCUS, HR HPV negative.   06/30/23: EMB-fragments of benign endometrium with stromal pseudodecidualization, consistent with hormone effect.  No hyperplasia or malignancy.  KUB 10/24: IUD seen in the pelvic Pelvic ultrasound 10/31: IUD in the uterus  Interval History: Doing well.  No changes since her last visit.  Past Medical/Surgical History: Past Medical History:  Diagnosis Date   PCOS (polycystic ovarian syndrome)    PTSD (post-traumatic stress disorder)     Past Surgical History:  Procedure Laterality Date   KNEE ARTHROSCOPY Left 12/05/2008   SPINAL CORD STIMULATOR INSERTION N/A 02/19/2016   Procedure: CERVICAL SPINAL CORD STIMULATOR INSERTION;  Surgeon: Odette Fraction, MD;  Location: [facility];  Service: Neurosurgery;  Laterality: N/A;   transvaginal oocyte retrieval  2020   2021    Family History  Problem Relation Age of Onset   Cervical cancer Sister        age 13    Leukemia Maternal Grandmother    Breast cancer Paternal Grandmother    Cancer - Ovarian Paternal Great-grandmother    Endometrial cancer Neg Hx    Prostate cancer Neg Hx    Pancreatic cancer Neg Hx     Social History   Socioeconomic History   Marital status: Single    Spouse name: Not on file   Number of children: Not on file   Years of education: Not on file   Highest education level: Not on file  Occupational History   Not on file  Tobacco Use   Smoking status: Former    Current packs/day: 0.50    Average packs/day: 0.5 packs/day for 10.0 years (5.0 ttl pk-yrs)    Types: Cigarettes   Smokeless tobacco: Never  Substance and Sexual Activity   Alcohol use: No   Drug use: No   Sexual activity: Yes  Other Topics Concern   Not on file  Social History Narrative   Not on file   Social Determinants of Health   Financial Resource Strain: Not on file  Food Insecurity: Not on file  Transportation Needs: Not on file  Physical Activity: Not on file  Stress: Not on file  Social Connections: Unknown (04/17/2022)   Received from [facility]   Social Network    Social Network: Not on file    Current Medications:  Current Outpatient Medications:    metFORMIN (GLUCOPHAGE) 500 MG tablet, Take by mouth., Disp: , Rfl:   Review of Systems: Denies appetite changes, fevers, chills, fatigue, unexplained weight changes. Denies hearing loss, neck lumps or masses, mouth sores, ringing in ears or voice changes. Denies cough or wheezing.  Denies shortness of breath. Denies chest pain or palpitations. Denies leg swelling. Denies abdominal distention, pain, blood in stools, constipation, diarrhea, nausea, vomiting, or early satiety. Denies pain with intercourse, dysuria, frequency, hematuria or incontinence. Denies hot flashes, pelvic pain, vaginal bleeding or vaginal discharge.   Denies joint pain, back pain or muscle pain/cramps. Denies itching, rash, or wounds. Denies  dizziness, headaches, numbness or seizures. Denies swollen lymph nodes or glands, denies easy bruising or bleeding. Denies anxiety, depression, confusion, or decreased concentration.  Physical Exam: BP 131/71 (BP Location: Right Arm, Patient Position: Sitting)   Pulse 72   Temp 98.6 F (37 C) (Oral)   Resp 20   Wt 273 lb (123.8 kg)   SpO2 97%   BMI 38.08 kg/m  General: Alert, oriented, no acute distress. GU: Normal appearing external genitalia without erythema, excoriation, or lesions.  Speculum exam reveals normal-appearing cervix.  The cervix was cleansed with Betadine x 3.  Polyp forceps were then used to attempt to grasp IUD strings within the cervix and lower uterine segment.  Multiple attempts were performed without success.  Patient tolerated the procedure well.  Laboratory & Radiologic Studies: Xray 10/24: An IUD projects over the central pelvis slightly left of midline. No dilated loops of bowel are seen to suggest obstruction. A spinal stimulator courses into the thoracic spine, incompletely imaged. No acute osseous abnormality is evident.  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  Attempt made to find IUD strings with polyp forceps today.  Unfortunately, this was unsuccessful.  I talked  to the patient about reaching out to her REI physician to see if they could perform hysteroscopy and removal of the IUD in clinic.  Backup would be to take her to the outpatient surgical center for similar procedure.  12 minutes of total time was spent for this patient encounter, including preparation, face-to-face counseling with the patient and coordination of care, and documentation of the encounter.  Eugene Garnet, MD  Division of Gynecologic Oncology  Department of Obstetrics and Gynecology  [facility]

## 2023-10-06 NOTE — Patient Instructions (Addendum)
We will work on arranging an outpatient procedure to remove the IUD. Our office will also reach out to Dr. April Manson to see if this can be done cheaper in his office.   We will contact you with a date for the examination under anesthesia with IUD removal with Dr. Pricilla Holm and this can always be cancelled if needed.

## 2023-10-06 NOTE — H&P (View-Only) (Signed)
 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  She reports having 5 periods a year.  She had previously received infertility treatment and undergone IVF cycles.  Unfortunately, she had to embryo transfers that did not result in a pregnancy.   Pelvic ultrasound on 07/18/2022 showed a uterus measuring 6.6 x 3.6 x 4.9 cm.  Endometrium measured 7.3 mm and had a microcystic structure.  Bilateral ovaries normal in size with antral follicle counts of 50, consistent with a polycystic morphology.   08/16/2022: Seen for a saline sonohysterogram and trial transfer in preparation for an upcoming IVF cycle.  Findings from this procedure included a normal-appearing uterine cavity without evidence of uterine polyps, submucosal fibroids, or intrauterine adhesions.  Endometrial biopsy was taken and shows fragmented proliferative endometrium with atypia consistent with complex atypical hyperplasia.   10/04/22: Mirena IUD placed.   12/23/22: EMB - focal, simple hyperplasia with squamous metaplasia in the background of marked progesterone effect, no atypia or malignancy.   03/24/23: EMB - focal, simple hyperplasia with squamous metaplasia in the background of marked progesterone effect, no atypia or malignancy. Pap ASCUS, HR HPV negative.   06/30/23: EMB-fragments of benign endometrium with stromal pseudodecidualization, consistent with hormone effect.  No hyperplasia or malignancy.  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  She reports having 5 periods a year.  She had previously received infertility treatment and undergone IVF cycles.  Unfortunately, she had to embryo  transfers that did not result in a pregnancy.   Pelvic ultrasound on 07/18/2022 showed a uterus measuring 6.6 x 3.6 x 4.9 cm.  Endometrium measured 7.3 mm and had a microcystic structure.  Bilateral ovaries normal in size with antral follicle counts of 50, consistent with a polycystic morphology.   08/16/2022: Seen for a saline sonohysterogram and trial transfer in preparation for an upcoming IVF cycle.  Findings from this procedure included a normal-appearing uterine cavity without evidence of uterine polyps, submucosal fibroids, or intrauterine adhesions.  Endometrial biopsy was taken and shows fragmented proliferative endometrium with atypia consistent with complex atypical hyperplasia.   10/04/22: Mirena IUD placed.   12/23/22: EMB - focal, simple hyperplasia with squamous metaplasia in the background of marked progesterone effect, no atypia or malignancy.   03/24/23: EMB - focal, simple hyperplasia with squamous metaplasia in the background of marked progesterone effect, no atypia or malignancy. Pap ASCUS, HR HPV negative.   06/30/23: EMB-fragments of benign endometrium with stromal pseudodecidualization, consistent with hormone effect.  No hyperplasia or malignancy.  KUB 10/24: IUD seen in the pelvic Pelvic ultrasound 10/31: IUD in the uterus  Interval History: Doing well.  No changes since her last visit.  Past Medical/Surgical History: Past Medical History:  Diagnosis Date   PCOS (polycystic ovarian syndrome)    PTSD (post-traumatic stress disorder)     Past Surgical History:  Procedure Laterality Date   KNEE ARTHROSCOPY Left 12/05/2008   SPINAL CORD STIMULATOR INSERTION N/A 02/19/2016   Procedure: CERVICAL SPINAL CORD STIMULATOR INSERTION;  Surgeon: Odette Fraction, MD;  Location: [facility];  Service: Neurosurgery;  Laterality: N/A;   transvaginal oocyte retrieval  2020   2021    Family History  Problem Relation Age of Onset   Cervical cancer Sister        age 13    Leukemia Maternal Grandmother    Breast cancer Paternal Grandmother    Cancer - Ovarian Paternal Great-grandmother    Endometrial cancer Neg Hx    Prostate cancer Neg Hx    Pancreatic cancer Neg Hx     Social History   Socioeconomic History   Marital status: Single    Spouse name: Not on file   Number of children: Not on file   Years of education: Not on file   Highest education level: Not on file  Occupational History   Not on file  Tobacco Use   Smoking status: Former    Current packs/day: 0.50    Average packs/day: 0.5 packs/day for 10.0 years (5.0 ttl pk-yrs)    Types: Cigarettes   Smokeless tobacco: Never  Substance and Sexual Activity   Alcohol use: No   Drug use: No   Sexual activity: Yes  Other Topics Concern   Not on file  Social History Narrative   Not on file   Social Determinants of Health   Financial Resource Strain: Not on file  Food Insecurity: Not on file  Transportation Needs: Not on file  Physical Activity: Not on file  Stress: Not on file  Social Connections: Unknown (04/17/2022)   Received from [facility]   Social Network    Social Network: Not on file    Current Medications:  Current Outpatient Medications:    metFORMIN (GLUCOPHAGE) 500 MG tablet, Take by mouth., Disp: , Rfl:   Review of Systems: Denies appetite changes, fevers, chills, fatigue, unexplained weight changes. Denies hearing loss, neck lumps or masses, mouth sores, ringing in ears or voice changes. Denies cough or wheezing.  Denies shortness of breath. Denies chest pain or palpitations. Denies leg swelling. Denies abdominal distention, pain, blood in stools, constipation, diarrhea, nausea, vomiting, or early satiety. Denies pain with intercourse, dysuria, frequency, hematuria or incontinence. Denies hot flashes, pelvic pain, vaginal bleeding or vaginal discharge.   Denies joint pain, back pain or muscle pain/cramps. Denies itching, rash, or wounds. Denies  dizziness, headaches, numbness or seizures. Denies swollen lymph nodes or glands, denies easy bruising or bleeding. Denies anxiety, depression, confusion, or decreased concentration.  Physical Exam: BP 131/71 (BP Location: Right Arm, Patient Position: Sitting)   Pulse 72   Temp 98.6 F (37 C) (Oral)   Resp 20   Wt 273 lb (123.8 kg)   SpO2 97%   BMI 38.08 kg/m  General: Alert, oriented, no acute distress. GU: Normal appearing external genitalia without erythema, excoriation, or lesions.  Speculum exam reveals normal-appearing cervix.  The cervix was cleansed with Betadine x 3.  Polyp forceps were then used to attempt to grasp IUD strings within the cervix and lower uterine segment.  Multiple attempts were performed without success.  Patient tolerated the procedure well.  Laboratory & Radiologic Studies: Xray 10/24: An IUD projects over the central pelvis slightly left of midline. No dilated loops of bowel are seen to suggest obstruction. A spinal stimulator courses into the thoracic spine, incompletely imaged. No acute osseous abnormality is evident.  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  Attempt made to find IUD strings with polyp forceps today.  Unfortunately, this was unsuccessful.  I talked  to the patient about reaching out to her REI physician to see if they could perform hysteroscopy and removal of the IUD in clinic.  Backup would be to take her to the outpatient surgical center for similar procedure.  12 minutes of total time was spent for this patient encounter, including preparation, face-to-face counseling with the patient and coordination of care, and documentation of the encounter.  Eugene Garnet, MD  Division of Gynecologic Oncology  Department of Obstetrics and Gynecology  [facility]

## 2023-10-10 ENCOUNTER — Telehealth: Payer: Self-pay

## 2023-10-10 ENCOUNTER — Encounter: Payer: Self-pay | Admitting: Gynecologic Oncology

## 2023-10-10 DIAGNOSIS — T8332XA Displacement of intrauterine contraceptive device, initial encounter: Secondary | ICD-10-CM | POA: Insufficient documentation

## 2023-10-10 NOTE — Telephone Encounter (Signed)
-----   Message from Doylene Bode sent at 10/10/2023 10:32 AM EST ----- Please call the patient and see if she has heard from Dr. Lyndal Rainbow office about IUD removal.   I have her procedure booked for IUD removal in the [facility] for Novem 13. This will be the backup if Dr. Jeannie Fend cannot remove in the office. If we proceed with this, they will call her to discuss time, instructions etc.    From Dr. Winferd Humphrey note: "I talked to the patient about reaching out to her REI physician to see if they could perform hysteroscopy and removal of the IUD in clinic.  Backup would be to take her to the outpatient surgical center for similar procedure."

## 2023-10-10 NOTE — Telephone Encounter (Signed)
I spoke to Christine Johnston regarding message below from Warner Mccreedy NP  She states she has not called her REI office and thought our office was supposed to do that. She is aware it would be better for her to call them to arrange an appointment according to her schedule. She voiced an understanding and will call back to let us know when she will be seeing them.

## 2023-10-13 ENCOUNTER — Other Ambulatory Visit: Payer: Self-pay

## 2023-10-13 ENCOUNTER — Encounter (HOSPITAL_BASED_OUTPATIENT_CLINIC_OR_DEPARTMENT_OTHER): Payer: Self-pay | Admitting: Gynecologic Oncology

## 2023-10-13 NOTE — Progress Notes (Addendum)
Spoke w/ via phone for pre-op interview: patient  Lab needs dos: UPT,  Lab results: NA COVID test: patient states asymptomatic no test needed. Arrive at 1015 10/18/23 NPO after MN except clear liquids. Clear liquids from MN until 0915 Med rec completed. Medications to take morning of surgery: none Diabetic medication: none morning of surgery Patient instructed no nail polish to be worn day of surgery. Patient instructed to bring photo id and insurance card day of surgery. Patient aware to have driver (ride ) / caregiver for 24 hours after surgery. husband to drive. Special Instructions: Pt to bring Spinal stimulator remote Patient verbalized understanding of instructions that were given at this phone interview. Patient denies shortness of breath, chest pain, fever, cough at this phone interview.  Pt on Metformin for fertility (not diabetic). Still check CBG per Dr. Rosalene Billings. Put in an order for pre-op CBG check. Please consult anesthesiologist DOS to see if any other CBGs needed.

## 2023-10-16 ENCOUNTER — Telehealth: Payer: Self-pay | Admitting: Gynecologic Oncology

## 2023-10-16 NOTE — Telephone Encounter (Signed)
Spoke with the patient. I had attempted to get in touch with Dr. Lyndal Rainbow office without success. She was able to speak with someone today who hold her procedure could be done there under some anesthesia but likely would not be covered by her insurance. Her preference is to move forward with procedure with me as planned this week.  Christine Garnet MD Gynecologic Oncology

## 2023-10-17 ENCOUNTER — Telehealth: Payer: Self-pay | Admitting: *Deleted

## 2023-10-17 NOTE — Telephone Encounter (Signed)
Telephone call to check on pre-operative status.  Patient compliant with pre-operative instructions.  Reinforced nothing to eat after midnight. Clear liquids until 09:00. Patient to arrive at 10:00.  No questions or concerns voiced.  Instructed to call for any needs.  ?

## 2023-10-18 ENCOUNTER — Ambulatory Visit (HOSPITAL_BASED_OUTPATIENT_CLINIC_OR_DEPARTMENT_OTHER): Payer: BC Managed Care – PPO | Admitting: Anesthesiology

## 2023-10-18 ENCOUNTER — Ambulatory Visit (HOSPITAL_BASED_OUTPATIENT_CLINIC_OR_DEPARTMENT_OTHER)
Admission: AD | Admit: 2023-10-18 | Discharge: 2023-10-18 | Disposition: A | Discharge status: Home / Self Care | Payer: BC Managed Care – PPO | Attending: Gynecologic Oncology | Admitting: Gynecologic Oncology

## 2023-10-18 ENCOUNTER — Other Ambulatory Visit: Payer: Self-pay

## 2023-10-18 ENCOUNTER — Ambulatory Visit (HOSPITAL_BASED_OUTPATIENT_CLINIC_OR_DEPARTMENT_OTHER): Payer: Self-pay | Admitting: Anesthesiology

## 2023-10-18 ENCOUNTER — Encounter (HOSPITAL_BASED_OUTPATIENT_CLINIC_OR_DEPARTMENT_OTHER)
Admission: AD | Disposition: A | Discharge status: Home / Self Care | Payer: Self-pay | Source: Home / Self Care | Attending: Gynecologic Oncology

## 2023-10-18 ENCOUNTER — Encounter (HOSPITAL_BASED_OUTPATIENT_CLINIC_OR_DEPARTMENT_OTHER): Payer: Self-pay | Admitting: Gynecologic Oncology

## 2023-10-18 DIAGNOSIS — Z30432 Encounter for removal of intrauterine contraceptive device: Secondary | ICD-10-CM | POA: Diagnosis present

## 2023-10-18 DIAGNOSIS — Z8742 Personal history of other diseases of the female genital tract: Secondary | ICD-10-CM | POA: Insufficient documentation

## 2023-10-18 DIAGNOSIS — Z6838 Body mass index (BMI) 38.0-38.9, adult: Secondary | ICD-10-CM | POA: Insufficient documentation

## 2023-10-18 DIAGNOSIS — E282 Polycystic ovarian syndrome: Secondary | ICD-10-CM | POA: Diagnosis not present

## 2023-10-18 DIAGNOSIS — Z01818 Encounter for other preprocedural examination: Secondary | ICD-10-CM

## 2023-10-18 DIAGNOSIS — N8502 Endometrial intraepithelial neoplasia [EIN]: Secondary | ICD-10-CM | POA: Diagnosis not present

## 2023-10-18 DIAGNOSIS — Z87891 Personal history of nicotine dependence: Secondary | ICD-10-CM | POA: Diagnosis not present

## 2023-10-18 DIAGNOSIS — T8332XD Displacement of intrauterine contraceptive device, subsequent encounter: Secondary | ICD-10-CM

## 2023-10-18 DIAGNOSIS — E66813 Obesity, class 3: Secondary | ICD-10-CM | POA: Insufficient documentation

## 2023-10-18 HISTORY — PX: IUD REMOVAL: SHX5392

## 2023-10-18 LAB — POCT PREGNANCY, URINE: Preg Test, Ur: NEGATIVE

## 2023-10-18 SURGERY — EXAM UNDER ANESTHESIA
Anesthesia: General | Site: Uterus

## 2023-10-18 MED ORDER — SCOPOLAMINE 1 MG/3DAYS TD PT72
1.0000 | MEDICATED_PATCH | TRANSDERMAL | Status: DC
Start: 2023-10-18 — End: 2023-10-18
  Administered 2023-10-18: 1.5 mg via TRANSDERMAL

## 2023-10-18 MED ORDER — BUPIVACAINE HCL 0.25 % IJ SOLN
INTRAMUSCULAR | Status: DC | PRN
  Administered 2023-10-18: 10 mL

## 2023-10-18 MED ORDER — ACETAMINOPHEN 500 MG PO TABS
1000.0000 mg | ORAL_TABLET | ORAL | Status: AC
Start: 2023-10-18 — End: 2023-10-18
  Administered 2023-10-18: 1000 mg via ORAL

## 2023-10-18 MED ORDER — SCOPOLAMINE 1 MG/3DAYS TD PT72
MEDICATED_PATCH | TRANSDERMAL | Status: AC
  Filled 2023-10-18: qty 1

## 2023-10-18 MED ORDER — MIDAZOLAM HCL 5 MG/5ML IJ SOLN
INTRAMUSCULAR | Status: DC | PRN
  Administered 2023-10-18: 2 mg via INTRAVENOUS

## 2023-10-18 MED ORDER — LIDOCAINE 2% (20 MG/ML) 5 ML SYRINGE
INTRAMUSCULAR | Status: DC | PRN
  Administered 2023-10-18: 80 mg via INTRAVENOUS

## 2023-10-18 MED ORDER — DEXAMETHASONE SODIUM PHOSPHATE 10 MG/ML IJ SOLN
INTRAMUSCULAR | Status: DC | PRN
  Administered 2023-10-18: 10 mg via INTRAVENOUS

## 2023-10-18 MED ORDER — LACTATED RINGERS IV SOLN: INTRAVENOUS | Status: DC

## 2023-10-18 MED ORDER — FENTANYL CITRATE (PF) 100 MCG/2ML IJ SOLN
INTRAMUSCULAR | Status: AC
  Filled 2023-10-18: qty 2

## 2023-10-18 MED ORDER — SODIUM CHLORIDE 0.9 % IR SOLN
Status: DC | PRN
  Administered 2023-10-18: 3000 mL

## 2023-10-18 MED ORDER — DEXAMETHASONE SODIUM PHOSPHATE 10 MG/ML IJ SOLN
4.0000 mg | INTRAMUSCULAR | Status: DC
Start: 2023-10-18 — End: 2023-10-18

## 2023-10-18 MED ORDER — PROPOFOL 10 MG/ML IV BOLUS
INTRAVENOUS | Status: AC
Start: 2023-10-18 — End: ?
  Filled 2023-10-18: qty 20

## 2023-10-18 MED ORDER — ACETAMINOPHEN 500 MG PO TABS
ORAL_TABLET | ORAL | Status: AC
  Filled 2023-10-18: qty 2

## 2023-10-18 MED ORDER — SODIUM CHLORIDE 0.9 % IV SOLN: INTRAVENOUS | Status: DC

## 2023-10-18 MED ORDER — ONDANSETRON HCL 4 MG/2ML IJ SOLN
INTRAMUSCULAR | Status: DC | PRN
  Administered 2023-10-18: 4 mg via INTRAVENOUS

## 2023-10-18 MED ORDER — LIDOCAINE HCL (CARDIAC) PF 100 MG/5ML IV SOSY: PREFILLED_SYRINGE | INTRAVENOUS | Status: DC | PRN

## 2023-10-18 MED ORDER — PROPOFOL 10 MG/ML IV BOLUS
INTRAVENOUS | Status: DC | PRN
  Administered 2023-10-18: 150 mg via INTRAVENOUS

## 2023-10-18 MED ORDER — LIDOCAINE HCL (PF) 2 % IJ SOLN
INTRAMUSCULAR | Status: AC
Start: 2023-10-18 — End: ?
  Filled 2023-10-18: qty 5

## 2023-10-18 MED ORDER — MIDAZOLAM HCL 2 MG/2ML IJ SOLN
INTRAMUSCULAR | Status: AC
  Filled 2023-10-18: qty 2

## 2023-10-18 MED ORDER — STERILE WATER FOR IRRIGATION IR SOLN
Status: DC | PRN
  Administered 2023-10-18: 500 mL

## 2023-10-18 MED ORDER — ONDANSETRON HCL 4 MG/2ML IJ SOLN
INTRAMUSCULAR | Status: AC
  Filled 2023-10-18: qty 2

## 2023-10-18 MED ORDER — DEXAMETHASONE SODIUM PHOSPHATE 10 MG/ML IJ SOLN
INTRAMUSCULAR | Status: AC
  Filled 2023-10-18: qty 1

## 2023-10-18 MED ORDER — FENTANYL CITRATE (PF) 100 MCG/2ML IJ SOLN
INTRAMUSCULAR | Status: DC | PRN
  Administered 2023-10-18: 50 ug via INTRAVENOUS
  Administered 2023-10-18 (×2): 25 ug via INTRAVENOUS

## 2023-10-18 SURGICAL SUPPLY — 21 items
BLADE SURG 11 STRL SS (BLADE) IMPLANT
BLADE SURG 15 STRL LF DISP TIS (BLADE) IMPLANT
BLADE SURG 15 STRL SS (BLADE)
CATH ROBINSON RED A/P 16FR (CATHETERS) IMPLANT
GAUZE 4X4 16PLY ~~LOC~~+RFID DBL (SPONGE) ×1 IMPLANT
GLOVE BIO SURGEON STRL SZ 6 (GLOVE) ×2 IMPLANT
GLOVE BIO SURGEON STRL SZ7 (GLOVE) IMPLANT
GLOVE SURG SS PI 7.5 STRL IVOR (GLOVE) IMPLANT
GOWN STRL REUS W/TWL XL LVL3 (GOWN DISPOSABLE) IMPLANT
KIT PROCEDURE FLUENT (KITS) IMPLANT
KIT TURNOVER CYSTO (KITS) ×1 IMPLANT
NDL HYPO 25X1 1.5 SAFETY (NEEDLE) ×1 IMPLANT
NEEDLE HYPO 25X1 1.5 SAFETY (NEEDLE) ×1 IMPLANT
NS IRRIG 500ML POUR BTL (IV SOLUTION) ×1 IMPLANT
PACK PERINEAL COLD (PAD) ×1 IMPLANT
PACK VAGINAL WOMENS (CUSTOM PROCEDURE TRAY) ×1 IMPLANT
SEAL ROD LENS SCOPE MYOSURE (ABLATOR) IMPLANT
SLEEVE SCD COMPRESS KNEE MED (STOCKING) ×1 IMPLANT
SYR BULB IRRIG 60ML STRL (SYRINGE) ×1 IMPLANT
TOWEL OR 17X24 6PK STRL BLUE (TOWEL DISPOSABLE) ×2 IMPLANT
WATER STERILE IRR 500ML POUR (IV SOLUTION) ×1 IMPLANT

## 2023-10-18 NOTE — Anesthesia Postprocedure Evaluation (Signed)
Anesthesia Post Note  Patient: Christine Johnston  Procedure(s) Performed: HYSTEROSCOPY (Uterus) INTRAUTERINE DEVICE (IUD) REMOVAL (Uterus)     Patient location during evaluation: PACU Anesthesia Type: General Level of consciousness: awake and alert Pain management: pain level controlled Vital Signs Assessment: post-procedure vital signs reviewed and stable Respiratory status: spontaneous breathing, nonlabored ventilation, respiratory function stable and patient connected to nasal cannula oxygen Cardiovascular status: blood pressure returned to baseline and stable Postop Assessment: no apparent nausea or vomiting Anesthetic complications: no   No notable events documented.  Last Vitals:  Vitals:   10/18/23 1259 10/18/23 1330  BP: 110/66 122/68  Pulse:  61  Resp:  16  Temp: 37 C 36.7 C  SpO2: 96% 98%    Last Pain:  Vitals:   10/18/23 1330  TempSrc:   PainSc: 0-No pain                 Collene Schlichter

## 2023-10-18 NOTE — Interval H&P Note (Signed)
History and Physical Interval Note:  10/18/2023 10:44 AM  Christine Johnston  has presented today for surgery, with the diagnosis of iud threads lost.  The various methods of treatment have been discussed with the patient and family. After consideration of risks, benefits and other options for treatment, the patient has consented to  Procedure(s): EXAM UNDER ANESTHESIA, POSSIBLE HYSTEROSCOPY (N/A) INTRAUTERINE DEVICE (IUD) REMOVAL (N/A) as a surgical intervention.  The patient's history has been reviewed, patient examined, no change in status, stable for surgery.  I have reviewed the patient's chart and labs.  Questions were answered to the patient's satisfaction.     Carver Fila

## 2023-10-18 NOTE — Anesthesia Preprocedure Evaluation (Signed)
Anesthesia Evaluation  Patient identified by MRN, date of birth, ID band Patient awake    Reviewed: Allergy & Precautions, NPO status , Patient's Chart, lab work & pertinent test results  Airway Mallampati: II  TM Distance: >3 FB Neck ROM: Full    Dental  (+) Teeth Intact, Dental Advisory Given   Pulmonary former smoker   Pulmonary exam normal breath sounds clear to auscultation       Cardiovascular negative cardio ROS Normal cardiovascular exam Rhythm:Regular Rate:Normal     Neuro/Psych  PSYCHIATRIC DISORDERS Anxiety     S/p SCS    GI/Hepatic negative GI ROS, Neg liver ROS,,,  Endo/Other    Class 3 obesity  Renal/GU negative Renal ROS     Musculoskeletal negative musculoskeletal ROS (+)    Abdominal   Peds  Hematology negative hematology ROS (+)   Anesthesia Other Findings Day of surgery medications reviewed with the patient.  Reproductive/Obstetrics IUD threads lost                             Anesthesia Physical Anesthesia Plan  ASA: 2  Anesthesia Plan: General   Post-op Pain Management: Tylenol PO (pre-op)*   Induction: Intravenous  PONV Risk Score and Plan: 4 or greater and Midazolam, Dexamethasone and Ondansetron  Airway Management Planned: LMA  Additional Equipment:   Intra-op Plan:   Post-operative Plan: Extubation in OR  Informed Consent: I have reviewed the patients History and Physical, chart, labs and discussed the procedure including the risks, benefits and alternatives for the proposed anesthesia with the patient or authorized representative who has indicated his/her understanding and acceptance.     Dental advisory given  Plan Discussed with: CRNA  Anesthesia Plan Comments:        Anesthesia Quick Evaluation

## 2023-10-18 NOTE — Op Note (Signed)
OPERATIVE NOTE  PATIENT: Christine Johnston DATE: 10/18/23  Preop Diagnosis: Mirena IUD in place, strings not visible  Postoperative Diagnosis: same as above  Surgery: EUA, hysteroscopy with removal of the Mirena IUD  Surgeon: Eugene Garnet, MD  Assistant: none  Anesthesia: LMA   Estimated blood loss: minimal  IVF:  see I&O flowsheet  Urine output: n/a   Complications: None apparent  Pathology: none, Mirena IUD removed but not sent to pathology  Operative findings: Small mobile uterus on EUA. Unable to locate IUD strings using polyp forceps. On hysteroscopy, Mirena IUD strings visible in proximal cervix. IUD within the cavity in appropriate location, normal appearing endometrium.  Procedure: The patient was identified in the preoperative holding area. Informed consent was signed on the chart. Patient was seen history was reviewed and exam was performed.   The patient was then taken to the operating room and placed in the supine position with SCD hose on. General anesthesia was then induced without difficulty. She was then placed in the dorsolithotomy position. The perineum was prepped with Betadine. The vagina was prepped with Betadine. The patient was then draped after the prep was dried.   Timeout was performed the patient, procedure, antibiotic, allergy, and length of procedure.   The speculum was placed in the vagina. The single tooth tenaculum was placed on the anterior lip of the cervix. The cervix was already noted to be dilated sufficiently to allow for passage of the polyp forceps. Multiple attempts were performed to grasp IUD strings within the cervical canal or lower uterine segment without success.   A paracervical block was then performed with 10 cc of 1% lidocaine. The operative hysteroscope was placed through the cervix and into the uterine cavity with findings as noted above. Hysteroscopic graspers were then used to grasp the IUD strings within the  endocervical canal and the IUD was removed with withdrawal of the hysteroscope.   The tenaculum was removed and hemostasis was observed.   All instrument, suture, laparotomy, Ray-Tec, and needle counts were correct x2. The patient tolerated the procedure well and was taken recovery room in stable condition.  Christine Fila, MD

## 2023-10-18 NOTE — OR Nursing (Signed)
IUD removed by Dr. Pricilla Holm at [phone]

## 2023-10-18 NOTE — Transfer of Care (Signed)
Immediate Anesthesia Transfer of Care Note  Patient: Christine Johnston  Procedure(s) Performed: HYSTEROSCOPY (Uterus) INTRAUTERINE DEVICE (IUD) REMOVAL (Uterus)  Patient Location: PACU  Anesthesia Type:General  Level of Consciousness: awake, alert , oriented, and patient cooperative  Airway & Oxygen Therapy: Patient Spontanous Breathing  Post-op Assessment: Report given to RN and Post -op Vital signs reviewed and stable  Post vital signs: Reviewed and stable  Last Vitals:  Vitals Value Taken Time  BP 117/66 10/18/23 1238  Temp    Pulse 62 10/18/23 1241  Resp 16 10/18/23 1241  SpO2 96 % 10/18/23 1241  Vitals shown include unfiled device data.  Last Pain:  Vitals:   10/18/23 1022  TempSrc: Oral  PainSc: 0-No pain      Patients Stated Pain Goal: 1 (10/18/23 1022)  Complications: No notable events documented.

## 2023-10-18 NOTE — Discharge Instructions (Addendum)
AFTER SURGERY INSTRUCTIONS  Return to work:  1 day if applicable  Activity: 1. Be up and out of the bed during the day.  Take a nap if needed.  You may walk up steps but be careful and use the hand rail.    2. No driving for minimum 24 hours after surgery.  Do not drive if you are taking narcotic pain medicine and make sure that your reaction time has returned.   3. You can shower and bathe without restrictions.   4. You may experience vaginal spotting and discharge after surgery.  The spotting is normal but if you experience heavy bleeding, call our office.  5. Take Tylenol or ibuprofen first for pain if you are able to take these medications and only use narcotic pain medication for severe pain not relieved by the Tylenol or Ibuprofen.  Monitor your Tylenol intake to a max of 4,000 mg in a 24 hour period. You can alternate these medications after surgery.  Diet: 1. Low sodium Heart Healthy Diet is recommended but you are cleared to resume your normal (before surgery) diet after your procedure.  2. It is safe to use a laxative, such as Miralax or Colace, if you have difficulty moving your bowels.   Wound Care: 1. Keep clean and dry.  Shower daily.  Reasons to call the Doctor: Fever - Oral temperature greater than 100.4 degrees Fahrenheit Foul-smelling vaginal discharge Difficulty urinating Nausea and vomiting Increased pain at the site of the incision that is unrelieved with pain medicine. Difficulty breathing with or without chest pain New calf pain especially if only on one side Sudden, continuing increased vaginal bleeding with or without clots.   Contacts: For questions or concerns you should contact:  Dr. Eugene Garnet at [phone]  Warner Mccreedy, NP at [phone]  After Hours: call [phone] and have the GYN Oncologist paged/contacted (after 5 pm or on the weekends).  Messages sent via mychart are for non-urgent matters and are not responded to after hours  so for urgent needs, please call the after hours number.    You were given Tylenol prior to your surgical procedure today, please do not take anymore Tylenol until after 4:30pm today

## 2023-10-18 NOTE — Anesthesia Procedure Notes (Signed)
Procedure Name: LMA Insertion Date/Time: 10/18/2023 12:13 PM  Performed by: Bishop Limbo, CRNAPre-anesthesia Checklist: Patient identified, Emergency Drugs available, Suction available and Patient being monitored Patient Re-evaluated:Patient Re-evaluated prior to induction Oxygen Delivery Method: Circle system utilized Preoxygenation: Pre-oxygenation with 100% oxygen Induction Type: IV induction LMA: LMA inserted LMA Size: 4.0 Number of attempts: 1 Placement Confirmation: ETT inserted through vocal cords under direct vision, positive ETCO2 and breath sounds checked- equal and bilateral Tube secured with: Tape Dental Injury: Teeth and Oropharynx as per pre-operative assessment

## 2023-10-19 ENCOUNTER — Telehealth: Payer: Self-pay | Admitting: *Deleted

## 2023-10-19 NOTE — Telephone Encounter (Signed)
Attempted to reach patient for post op call. Left voicemail requesting call back.

## 2023-10-19 NOTE — Telephone Encounter (Signed)
Spoke with Ms. Burch this afternoon. She states she is eating, drinking and urinating well. She has not had a BM yet but is passing gas.  Encouraged her to drink plenty of water. She denies fever or chills. She rates her pain 0/10.   Instructed to call office with any fever, chills, purulent drainage, uncontrolled pain or any other questions or concerns. Patient verbalizes understanding.   Pt aware of post op appointments as well as the office number [phone] and after hours number [phone] to call if she has any questions or concerns

## 2023-10-20 ENCOUNTER — Encounter (HOSPITAL_BASED_OUTPATIENT_CLINIC_OR_DEPARTMENT_OTHER): Payer: Self-pay | Admitting: Gynecologic Oncology

## 2024-08-02 ENCOUNTER — Telehealth: Payer: Self-pay | Admitting: *Deleted

## 2024-08-02 NOTE — Telephone Encounter (Signed)
 Spoke with the patient and scheduled an appt with Dr Rogelio on 9/3. Patient aware

## 2024-08-06 ENCOUNTER — Encounter: Payer: Self-pay | Admitting: Obstetrics & Gynecology

## 2024-08-07 ENCOUNTER — Encounter: Payer: Self-pay | Admitting: Obstetrics & Gynecology

## 2024-08-07 ENCOUNTER — Inpatient Hospital Stay: Attending: Obstetrics & Gynecology | Admitting: Obstetrics & Gynecology

## 2024-08-07 VITALS — BP 124/58 | HR 66 | Temp 98.3°F | Resp 16 | Ht 71.0 in | Wt 266.0 lb

## 2024-08-07 DIAGNOSIS — Z7984 Long term (current) use of oral hypoglycemic drugs: Secondary | ICD-10-CM | POA: Diagnosis not present

## 2024-08-07 DIAGNOSIS — E282 Polycystic ovarian syndrome: Secondary | ICD-10-CM | POA: Diagnosis not present

## 2024-08-07 DIAGNOSIS — F4310 Post-traumatic stress disorder, unspecified: Secondary | ICD-10-CM | POA: Diagnosis not present

## 2024-08-07 DIAGNOSIS — Z79890 Hormone replacement therapy (postmenopausal): Secondary | ICD-10-CM | POA: Insufficient documentation

## 2024-08-07 DIAGNOSIS — Z800 Family history of malignant neoplasm of digestive organs: Secondary | ICD-10-CM | POA: Insufficient documentation

## 2024-08-07 DIAGNOSIS — Z87891 Personal history of nicotine dependence: Secondary | ICD-10-CM | POA: Insufficient documentation

## 2024-08-07 DIAGNOSIS — Z8049 Family history of malignant neoplasm of other genital organs: Secondary | ICD-10-CM | POA: Insufficient documentation

## 2024-08-07 DIAGNOSIS — N978 Female infertility of other origin: Secondary | ICD-10-CM | POA: Insufficient documentation

## 2024-08-07 DIAGNOSIS — Z803 Family history of malignant neoplasm of breast: Secondary | ICD-10-CM | POA: Diagnosis not present

## 2024-08-07 DIAGNOSIS — Z975 Presence of (intrauterine) contraceptive device: Secondary | ICD-10-CM | POA: Diagnosis not present

## 2024-08-07 DIAGNOSIS — Z8041 Family history of malignant neoplasm of ovary: Secondary | ICD-10-CM | POA: Insufficient documentation

## 2024-08-07 DIAGNOSIS — N8502 Endometrial intraepithelial neoplasia [EIN]: Secondary | ICD-10-CM | POA: Diagnosis present

## 2024-08-07 MED ORDER — MEGESTROL ACETATE 40 MG PO TABS: 80.0000 mg | ORAL_TABLET | Freq: Two times a day (BID) | ORAL | 3 refills | Status: AC

## 2024-08-07 NOTE — Patient Instructions (Signed)
Return in 3 months for endometrial biopsy.

## 2024-08-07 NOTE — Progress Notes (Addendum)
 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  She reports having 5 periods a year.  She had previously received infertility treatment and undergone IVF cycles.  Unfortunately, she had to embryo transfers that did not result in a pregnancy.   Pelvic ultrasound on 07/18/2022 showed a uterus measuring 6.6 x 3.6 x 4.9 cm.  Endometrium measured 7.3 mm and had a microcystic structure.  Bilateral ovaries normal in size with antral follicle counts of 50, consistent with a polycystic morphology.   08/16/2022: Seen for a saline sonohysterogram and trial transfer in preparation for an upcoming IVF cycle.  Findings from this procedure included a normal-appearing uterine cavity without evidence of uterine polyps, submucosal fibroids, or intrauterine adhesions.  Endometrial biopsy was taken and shows fragmented proliferative endometrium with atypia consistent with complex atypical hyperplasia.   10/04/22: Mirena  IUD placed.   12/23/22: EMB - focal, simple hyperplasia with squamous metaplasia in the background of marked progesterone effect, no atypia or malignancy.   03/24/23: EMB - focal, simple hyperplasia with squamous metaplasia in the background of marked progesterone effect, no atypia or malignancy. Pap ASCUS, HR HPV negative.  06/30/23: EMB-fragments of benign endometrium with stromal pseudodecidualization, consistent with hormone effect.  No hyperplasia or malignancy.  10/24: Inactive glands with diffuse stromal progestational effects.  Negative for endometrial intraepithelial neoplasia (EIN) and malignancy.   11/24: hsc LNG IUD removal  3/25: EMB--secretory endometrium w/squamous morules  8/25: D&C--atypical hyperplasia   Interval History: S/P IVF in 7/25.  Further ART plans  suspended by Dr. Yalcinkaya in light of histology from [facility] noted above. She has one frozen embryo ready for transfer. No abnormal bleeding, pelvic pain.  Discussed the use of AI scribe software for clinical note transcription with the patient, who gave verbal consent to proceed.      Past Medical/Surgical History: Past Medical History:  Diagnosis Date   PCOS (polycystic ovarian syndrome)    PTSD (post-traumatic stress disorder)     Past Surgical History:  Procedure Laterality Date   IUD REMOVAL N/A 10/18/2023   Procedure: INTRAUTERINE DEVICE (IUD) REMOVAL;  Surgeon: Viktoria Comer SAUNDERS, MD;  Location: [facility];  Service: Gynecology;  Laterality: N/A;   KNEE ARTHROSCOPY Left 12/05/2008   SPINAL CORD STIMULATOR INSERTION N/A 02/19/2016   Procedure: CERVICAL SPINAL CORD STIMULATOR INSERTION;  Surgeon: Deward Fabian, MD;  Location: [facility];  Service: Neurosurgery;  Laterality: N/A;   transvaginal oocyte retrieval  2020   2021    Family History  Problem Relation Age of Onset   Cervical cancer Sister        age 29   Breast cancer Sister    Leukemia Maternal Grandmother    Breast cancer Paternal Grandmother    Cancer - Ovarian Paternal Great-grandmother    Colon cancer Paternal Great-grandmother    Endometrial cancer Neg Hx    Prostate cancer Neg Hx    Pancreatic cancer Neg Hx     Social History   Socioeconomic History   Marital status: Single    Spouse name: Not on file   Number of children: Not on file   Years of education: Not on file   Highest education level: Not on file  Occupational History   Not on file  Tobacco Use   Smoking status: Former  Current packs/day: 0.50    Average packs/day: 0.5 packs/day for 10.0 years (5.0 ttl pk-yrs)    Types: Cigarettes   Smokeless tobacco: Never  Substance and Sexual Activity   Alcohol use: No    Comment: Occ   Drug use: No   Sexual activity: Yes  Other Topics Concern   Not on file  Social History  Narrative   Not on file   Social Drivers of Health   Financial Resource Strain: Not on file  Food Insecurity: No Food Insecurity (08/06/2024)   Hunger Vital Sign    Worried About Running Out of Food in the Last Year: Never true    Ran Out of Food in the Last Year: Never true  Transportation Needs: No Transportation Needs (08/06/2024)   PRAPARE - Administrator, Civil Service (Medical): No    Lack of Transportation (Non-Medical): No  Physical Activity: Not on file  Stress: Not on file  Social Connections: Unknown (04/17/2022)   Received from [facility]   Social Network    Social Network: Not on file    Current Medications:  Current Outpatient Medications:    metFORMIN (GLUCOPHAGE) 1000 MG tablet, Take 1,000 mg by mouth 2 (two) times daily., Disp: , Rfl:    VITAMIN D PO, Take by mouth., Disp: , Rfl:   Review of Systems: Denies appetite changes, fevers, chills, fatigue, unexplained weight changes. Denies hearing loss, neck lumps or masses, mouth sores, ringing in ears or voice changes. Denies cough or wheezing.  Denies shortness of breath. Denies chest pain or palpitations. Denies leg swelling. Denies abdominal distention, pain, blood in stools, constipation, diarrhea, nausea, vomiting, or early satiety. Denies pain with intercourse, dysuria, frequency, hematuria or incontinence. Denies hot flashes, pelvic pain, vaginal bleeding or vaginal discharge.   Denies joint pain, back pain or muscle pain/cramps. Denies itching, rash, or wounds. Denies dizziness, headaches, numbness or seizures. Denies swollen lymph nodes or glands, denies easy bruising or bleeding. Denies anxiety, depression, confusion, or decreased concentration.  Physical Exam: BP (!) 124/58 (BP Location: Right Arm, Patient Position: Sitting)   Pulse 66   Temp 98.3 F (36.8 C) (Oral)   Resp 16   Ht 5' 11 (1.803 m)   Wt 266 lb (120.7 kg)   SpO2 98%   BMI 37.10 kg/m   Exam deferred  Assessment &  Plan: Christine Johnston is a 43 y.o. woman with a h/o EIN.  Prior histologic confirmation of reversion to normal endometrium following hormonal therapy w/an LNG IUD.  Now w/possible recurrence of the EIN.  She desires to preserve her fertility potential.   -Oral progestin therapy w/repeat EMB in 3 mos - Education materials provided    25 minutes of total time was spent for this patient encounter, including preparation, face-to-face counseling with the patient and coordination of care, and documentation of the encounter.  Olam Mill, MD  Division of Gynecologic Oncology  Department of Obstetrics and Gynecology  [facility]

## 2024-10-09 ENCOUNTER — Telehealth: Payer: Self-pay | Admitting: *Deleted

## 2024-10-09 NOTE — Telephone Encounter (Signed)
 LMOM for the patient to call the office back. Need to move the appt on 12/10 from the morning to the afternoon.

## 2024-10-15 NOTE — Telephone Encounter (Signed)
 Spoke with the patient and appt moved to 130 pm

## 2024-11-11 NOTE — Progress Notes (Unsigned)
Endometrial Biopsy Procedure Note  Pre-operative Diagnosis: ***  Post-operative Diagnosis: {ENT Condition:17813}  Indications: {:16409}  Procedure Details   Urine pregnancy test {:16410}.  The risks (including infection, bleeding, pain, and uterine perforation) and benefits of the procedure were explained to the patient and {desc; verbal/written:16408} informed consent was obtained.  Antibiotic prophylaxis against endocarditis {WAS:16407} indicated.   The patient was placed in the dorsal lithotomy position.  Bimanual exam showed the uterus to be in the {:16454} position.  A Graves' speculum inserted in the vagina, and the cervix prepped with povidone iodine.  Endocervical curettage with a Kevorkian curette {:16407} performed.   A sharp tenaculum was applied to the {:16411} lip of the cervix for stabilization.  A sterile uterine sound was used to sound the uterus to a depth of {:16831}cm.  A {:16412} was used to sample the endometrium.  Sample was sent for pathologic examination.  Condition: Stable  Complications: None  Plan:  The patient was advised to call for any fever or for prolonged or severe pain or bleeding. She was advised to use {meds; pain:16413} as needed for mild to moderate pain. She was advised to avoid vaginal intercourse for 48 hours or until the bleeding has completely stopped.  Attending Physician Documentation: {attending attestation:17855}

## 2024-11-13 ENCOUNTER — Telehealth: Payer: Self-pay | Admitting: *Deleted

## 2024-11-13 ENCOUNTER — Inpatient Hospital Stay: Admitting: Obstetrics & Gynecology

## 2024-11-13 DIAGNOSIS — N8502 Endometrial intraepithelial neoplasia [EIN]: Secondary | ICD-10-CM

## 2024-11-13 NOTE — Telephone Encounter (Signed)
 Per Dr Rogelio moved the patient from today to 12/23. Patient moved due to MD is surgery

## 2024-11-21 ENCOUNTER — Encounter: Payer: Self-pay | Admitting: Obstetrics & Gynecology

## 2024-11-26 ENCOUNTER — Inpatient Hospital Stay: Attending: Obstetrics & Gynecology | Admitting: Obstetrics & Gynecology

## 2024-11-26 VITALS — BP 128/76 | HR 69 | Temp 98.3°F | Resp 19 | Wt 269.2 lb

## 2024-11-26 DIAGNOSIS — Z79890 Hormone replacement therapy (postmenopausal): Secondary | ICD-10-CM | POA: Insufficient documentation

## 2024-11-26 DIAGNOSIS — N8502 Endometrial intraepithelial neoplasia [EIN]: Secondary | ICD-10-CM

## 2024-11-26 NOTE — Progress Notes (Signed)
 Endometrial Biopsy Procedure Note  Pre-operative Diagnosis: H/O EIN  Post-operative Diagnosis: same  Indications: see above  Procedure Details   Urine pregnancy test was not done.  The risks (including infection, bleeding, pain, and uterine perforation) and benefits of the procedure were explained to the patient and Written informed consent was obtained.    The patient was placed in the dorsal lithotomy position.  Bimanual exam showed the uterus to be in the anteroflexed position.  A Graves' speculum inserted in the vagina, and the cervix prepped with povidone iodine.   A sharp tenaculum was applied to the anterior lip of the cervix for stabilization.  A sterile uterine sound was used to sound the uterus to a depth of 7.5cm.  A Pipelle endometrial aspirator was used to sample the endometrium.  Sample was sent for pathologic examination.  Condition: Stable  Complications: None  Plan:  The patient was advised to call for any fever or for prolonged or severe pain or bleeding. She was advised to use OTC analgesics as needed for mild to moderate pain. She was advised to avoid vaginal intercourse for 48 hours or until the bleeding has completely stopped.  Assessment & Plan Endometrial intraepithelial neoplasia (EIN) Undergoing biopsy to assess EIN status. Results will determine further management.   - Repeat endometrial biopsy every three to six months for up to two years - If the histology from today shows: -Normal endometrium - will perform an additional one to two biopsies to confirm the absence of EH, EIN, or concomitant carcinoma. If normal endometrium is confirmed and the patient desires pregnancy, the patient can try to conceive  - EH without atypia - We follow the algorithm for premenopausal patients with a diagnosis of EH without atypia - Persistent EH with atypia (EIN) - Consider repeat D&C with hysteroscopy to exclude endometrial carcinoma   Olam Mill, MD

## 2024-11-26 NOTE — Patient Instructions (Addendum)
 Return in 3 to 6 months   VISIT SUMMARY: You came in today for a biopsy to evaluate a lesion. You have no allergies to iodine or local anesthetics, and you use ibuprofen as needed for pain management. Your Pap testing is up to date, and you recently received a refill on your medications.  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 Today, we performed an endometrial biopsy to assess the status of EIN. We will review the pathology results once they are available. If the biopsy results are favorable, we will schedule a follow-up appointment in three to six months.  INSTRUCTIONS: Please check the patient portal for your pathology results. If the results are favorable, we will schedule a follow-up appointment in three to six months.                      Contains text generated by Abridge.                                 Contains text generated by Abridge.

## 2024-11-27 ENCOUNTER — Encounter: Payer: Self-pay | Admitting: Obstetrics & Gynecology

## 2024-11-29 LAB — SURGICAL PATHOLOGY

## 2024-12-04 ENCOUNTER — Telehealth: Payer: Self-pay

## 2024-12-04 NOTE — Telephone Encounter (Signed)
-----   Message from Eleanor Epps, NP sent at 12/04/2024  3:05 PM EST ----- Please let the patient know her recent endometrial biopsy is showing persistent atypical hyperplasia (precancer). No cancer seen on the biopsy sample.   Dr. Rogelio would like to have a phone visit to discuss recommendations moving forward. Please arrange appt for January 14 in the am.   Dr. Viktoria she could also see the patient at some point so would put on a cancellation list for that.

## 2024-12-04 NOTE — Telephone Encounter (Signed)
 [name] is aware of message from [facility]. She is aware of the recent results from the endometrial biopsy. A phone visit has been scheduled with Dr.Jackson-Moore for 1/14 @ 8:30. Pt agrees to date/time

## 2024-12-16 ENCOUNTER — Telehealth: Payer: Self-pay

## 2024-12-16 NOTE — Telephone Encounter (Signed)
 Moved phone visit to 1/13 because provider is out of office on 1/14

## 2024-12-17 ENCOUNTER — Inpatient Hospital Stay: Attending: Obstetrics & Gynecology | Admitting: Obstetrics & Gynecology

## 2024-12-17 DIAGNOSIS — N8502 Endometrial intraepithelial neoplasia [EIN]: Secondary | ICD-10-CM

## 2024-12-17 NOTE — Progress Notes (Unsigned)
 Virtual Visit via Telephone Note  I connected with Erminio LITTIE Pouch on 12/17/2024 at  4:00 PM EST by telephone and verified that I am speaking with the correct person using two identifiers.  Location: Patient: Home Provider: Hospital   I discussed the limitations, risks, security and privacy concerns of performing an evaluation and management service by telephone and the availability of in person appointments. I also discussed with the patient that there may be a patient responsible charge related to this service. The patient expressed understanding and agreed to proceed.  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   History of Present Illness: Discussed the use of AI scribe software for clinical note transcription with the patient, who gave verbal consent to proceed. Christine Johnston is a 44 year old female who presents for follow-up after a recent biopsy showing abnormal endometrial cells.  She has a history of an abnormally thickened endometrial lining with abnormal cells, for which an IUD was previously placed as treatment. Recently, a biopsy was performed, and the patient was informed that abnormal cells were present again. She no longer has the IUD in place.  She is currently taking Megace  80 mg twice a day.        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 Viktoria  Follow Up Instructions:    I discussed the assessment and treatment plan with the patient. The patient was provided an opportunity to ask questions and all were answered. The patient agreed with the plan and demonstrated an understanding of the instructions.   The patient was advised to call back or seek an in-person evaluation if the symptoms worsen or if the condition fails to improve as anticipated.  I provided 20 minutes of non-face-to-face time  during this encounter.   Olam Mill, MD

## 2024-12-18 ENCOUNTER — Encounter: Payer: Self-pay | Admitting: Obstetrics & Gynecology

## 2024-12-18 ENCOUNTER — Inpatient Hospital Stay: Admitting: Obstetrics & Gynecology

## 2024-12-19 ENCOUNTER — Telehealth: Payer: Self-pay | Admitting: *Deleted

## 2024-12-19 NOTE — Telephone Encounter (Signed)
 Per Dr Rogelio moved patient's appt from 4/22 to 2/11. Patient aware

## 2024-12-20 ENCOUNTER — Other Ambulatory Visit: Payer: Self-pay | Admitting: Gynecologic Oncology

## 2024-12-20 ENCOUNTER — Other Ambulatory Visit (HOSPITAL_COMMUNITY): Payer: Self-pay

## 2024-12-20 ENCOUNTER — Other Ambulatory Visit: Payer: Self-pay

## 2024-12-20 DIAGNOSIS — N8502 Endometrial intraepithelial neoplasia [EIN]: Secondary | ICD-10-CM

## 2024-12-20 MED ORDER — LEVONORGESTREL 20 MCG/DAY IU IUD
1.0000 | INTRAUTERINE_SYSTEM | Freq: Once | INTRAUTERINE | 0 refills | Status: AC
  Filled 2024-12-20: qty 1, 1d supply, fill #0

## 2024-12-23 ENCOUNTER — Other Ambulatory Visit: Payer: Self-pay

## 2024-12-24 ENCOUNTER — Telehealth: Payer: Self-pay | Admitting: *Deleted

## 2024-12-24 NOTE — Telephone Encounter (Signed)
 Attempted to reach patient. Left voicemail requesting call back to [phone].

## 2024-12-27 ENCOUNTER — Other Ambulatory Visit (HOSPITAL_COMMUNITY): Payer: Self-pay

## 2025-01-01 ENCOUNTER — Inpatient Hospital Stay

## 2025-01-15 ENCOUNTER — Inpatient Hospital Stay

## 2025-01-15 ENCOUNTER — Inpatient Hospital Stay: Attending: Obstetrics & Gynecology | Admitting: Obstetrics & Gynecology

## 2025-01-15 DIAGNOSIS — N8502 Endometrial intraepithelial neoplasia [EIN]: Secondary | ICD-10-CM

## 2025-03-26 ENCOUNTER — Inpatient Hospital Stay: Admitting: Obstetrics & Gynecology
